# Patient Record
Sex: Male | Born: 1960
Health system: Southern US, Community
[De-identification: ages and names within clinical notes are randomized; demographics above are authoritative.]

## PROBLEM LIST (undated history)

## (undated) DIAGNOSIS — K219 Gastro-esophageal reflux disease without esophagitis: Secondary | ICD-10-CM

## (undated) DIAGNOSIS — F32A Depression, unspecified: Secondary | ICD-10-CM

## (undated) DIAGNOSIS — F329 Major depressive disorder, single episode, unspecified: Secondary | ICD-10-CM

## (undated) DIAGNOSIS — T7840XA Allergy, unspecified, initial encounter: Secondary | ICD-10-CM

## (undated) DIAGNOSIS — F419 Anxiety disorder, unspecified: Secondary | ICD-10-CM

## (undated) DIAGNOSIS — G44209 Tension-type headache, unspecified, not intractable: Secondary | ICD-10-CM

## (undated) DIAGNOSIS — I639 Cerebral infarction, unspecified: Secondary | ICD-10-CM

## (undated) DIAGNOSIS — E785 Hyperlipidemia, unspecified: Secondary | ICD-10-CM

## (undated) HISTORY — DX: Major depressive disorder, single episode, unspecified: F32.9

## (undated) HISTORY — DX: Gastro-esophageal reflux disease without esophagitis: K21.9

## (undated) HISTORY — PX: HERNIA REPAIR: SHX51

## (undated) HISTORY — DX: Depression, unspecified: F32.A

## (undated) HISTORY — DX: Anxiety disorder, unspecified: F41.9

## (undated) HISTORY — DX: Hyperlipidemia, unspecified: E78.5

## (undated) HISTORY — PX: TONSILLECTOMY: SHX5217

## (undated) HISTORY — DX: Cerebral infarction, unspecified: I63.9

## (undated) HISTORY — PX: LIGAMENT REPAIR: SHX5444

## (undated) HISTORY — DX: Tension-type headache, unspecified, not intractable: G44.209

## (undated) HISTORY — DX: Allergy, unspecified, initial encounter: T78.40XA

---

## 1999-06-10 ENCOUNTER — Ambulatory Visit (HOSPITAL_COMMUNITY): Admission: RE | Admit: 1999-06-10 | Discharge: 1999-06-10 | Payer: Self-pay | Admitting: *Deleted

## 1999-06-10 ENCOUNTER — Encounter: Payer: Self-pay | Admitting: *Deleted

## 1999-07-13 ENCOUNTER — Encounter: Admission: RE | Admit: 1999-07-13 | Discharge: 1999-09-14 | Payer: Self-pay | Admitting: *Deleted

## 1999-09-29 ENCOUNTER — Ambulatory Visit (HOSPITAL_COMMUNITY): Admission: RE | Admit: 1999-09-29 | Discharge: 1999-09-29 | Payer: Self-pay | Admitting: *Deleted

## 1999-09-29 ENCOUNTER — Encounter: Payer: Self-pay | Admitting: *Deleted

## 2004-09-21 ENCOUNTER — Ambulatory Visit: Payer: Self-pay | Admitting: Family Medicine

## 2004-12-05 ENCOUNTER — Ambulatory Visit: Payer: Self-pay | Admitting: Family Medicine

## 2005-03-26 ENCOUNTER — Emergency Department (HOSPITAL_COMMUNITY): Admission: EM | Admit: 2005-03-26 | Discharge: 2005-03-26 | Payer: Self-pay | Admitting: Emergency Medicine

## 2005-03-30 ENCOUNTER — Ambulatory Visit: Payer: Self-pay | Admitting: Family Medicine

## 2006-11-07 ENCOUNTER — Ambulatory Visit: Payer: Self-pay | Admitting: Family Medicine

## 2006-11-13 ENCOUNTER — Ambulatory Visit: Payer: Self-pay | Admitting: Family Medicine

## 2006-11-13 LAB — CONVERTED CEMR LAB
ALT: 28 units/L (ref 0–40)
AST: 26 units/L (ref 0–37)
Albumin: 4.1 g/dL (ref 3.5–5.2)
Alkaline Phosphatase: 46 units/L (ref 39–117)
BUN: 11 mg/dL (ref 6–23)
Basophils Absolute: 0.1 10*3/uL (ref 0.0–0.1)
Basophils Relative: 0.9 % (ref 0.0–1.0)
Bilirubin Urine: NEGATIVE
Bilirubin, Direct: 0.2 mg/dL (ref 0.0–0.3)
CO2: 27 meq/L (ref 19–32)
Calcium: 9.4 mg/dL (ref 8.4–10.5)
Chloride: 109 meq/L (ref 96–112)
Cholesterol: 146 mg/dL (ref 0–200)
Creatinine, Ser: 1.4 mg/dL (ref 0.4–1.5)
Creatinine,U: 315.1 mg/dL
Eosinophils Absolute: 0.4 10*3/uL (ref 0.0–0.6)
Eosinophils Relative: 6.2 % — ABNORMAL HIGH (ref 0.0–5.0)
GFR calc Af Amer: 71 mL/min
GFR calc non Af Amer: 59 mL/min
Glucose, Bld: 90 mg/dL (ref 70–99)
HCT: 40.6 % (ref 39.0–52.0)
HDL: 40.2 mg/dL (ref >39.0)
Hemoglobin: 14.6 g/dL (ref 13.0–17.0)
Ketones, ur: NEGATIVE mg/dL
LDL Cholesterol: 89 mg/dL (ref 0–99)
Leukocytes, UA: NEGATIVE
Lymphocytes Relative: 31.5 % (ref 12.0–46.0)
MCHC: 36 g/dL (ref 30.0–36.0)
MCV: 89 fL (ref 78.0–100.0)
Microalb Creat Ratio: 2.2 mg/g (ref 0.0–30.0)
Microalb, Ur: 0.7 mg/dL (ref 0.0–1.9)
Monocytes Absolute: 0.5 10*3/uL (ref 0.2–0.7)
Monocytes Relative: 7.3 % (ref 3.0–11.0)
Neutro Abs: 3.4 10*3/uL (ref 1.4–7.7)
Neutrophils Relative %: 54.1 % (ref 43.0–77.0)
Nitrite: NEGATIVE
Platelets: 228 10*3/uL (ref 150–400)
Potassium: 3.5 meq/L (ref 3.5–5.1)
RBC: 4.56 M/uL (ref 4.22–5.81)
RDW: 12.2 % (ref 11.5–14.6)
Sodium: 144 meq/L (ref 135–145)
Specific Gravity, Urine: 1.025 (ref 1.000–1.03)
TSH: 2.75 microintl units/mL (ref 0.35–5.50)
Total Bilirubin: 0.8 mg/dL (ref 0.3–1.2)
Total CHOL/HDL Ratio: 3.6
Total Protein, Urine: NEGATIVE mg/dL
Total Protein: 6.7 g/dL (ref 6.0–8.3)
Triglycerides: 82 mg/dL (ref 0–149)
Urine Glucose: NEGATIVE mg/dL
Urobilinogen, UA: 0.2 (ref 0.0–1.0)
VLDL: 16 mg/dL (ref 0–40)
WBC: 6.4 10*3/uL (ref 4.5–10.5)
pH: 6 (ref 5.0–8.0)

## 2006-12-05 ENCOUNTER — Ambulatory Visit: Payer: Self-pay | Admitting: Family Medicine

## 2007-07-23 DIAGNOSIS — E785 Hyperlipidemia, unspecified: Secondary | ICD-10-CM | POA: Insufficient documentation

## 2007-07-23 DIAGNOSIS — F329 Major depressive disorder, single episode, unspecified: Secondary | ICD-10-CM

## 2007-07-23 DIAGNOSIS — Z8679 Personal history of other diseases of the circulatory system: Secondary | ICD-10-CM

## 2007-08-20 ENCOUNTER — Encounter: Payer: Self-pay | Admitting: Family Medicine

## 2007-08-27 ENCOUNTER — Telehealth: Payer: Self-pay | Admitting: Family Medicine

## 2007-09-10 ENCOUNTER — Ambulatory Visit: Payer: Self-pay | Admitting: Family Medicine

## 2007-09-10 DIAGNOSIS — F411 Generalized anxiety disorder: Secondary | ICD-10-CM | POA: Insufficient documentation

## 2007-10-24 ENCOUNTER — Encounter: Payer: Self-pay | Admitting: Family Medicine

## 2007-12-05 ENCOUNTER — Telehealth: Payer: Self-pay | Admitting: Family Medicine

## 2007-12-20 ENCOUNTER — Ambulatory Visit: Payer: Self-pay | Admitting: Family Medicine

## 2007-12-20 DIAGNOSIS — K219 Gastro-esophageal reflux disease without esophagitis: Secondary | ICD-10-CM

## 2007-12-25 ENCOUNTER — Encounter: Payer: Self-pay | Admitting: Family Medicine

## 2008-02-11 ENCOUNTER — Encounter: Payer: Self-pay | Admitting: Family Medicine

## 2008-03-04 ENCOUNTER — Ambulatory Visit: Payer: Self-pay | Admitting: Family Medicine

## 2008-03-04 DIAGNOSIS — R42 Dizziness and giddiness: Secondary | ICD-10-CM | POA: Insufficient documentation

## 2008-03-09 LAB — CONVERTED CEMR LAB
ALT: 53 units/L (ref 0–53)
AST: 38 units/L — ABNORMAL HIGH (ref 0–37)
Albumin: 4.5 g/dL (ref 3.5–5.2)
Alkaline Phosphatase: 60 units/L (ref 39–117)
BUN: 10 mg/dL (ref 6–23)
Basophils Absolute: 0 10*3/uL (ref 0.0–0.1)
Basophils Relative: 0.8 % (ref 0.0–1.0)
Bilirubin, Direct: 0.1 mg/dL (ref 0.0–0.3)
CO2: 28 meq/L (ref 19–32)
Calcium: 10 mg/dL (ref 8.4–10.5)
Chloride: 100 meq/L (ref 96–112)
Creatinine, Ser: 1 mg/dL (ref 0.4–1.5)
Eosinophils Absolute: 0.2 10*3/uL (ref 0.0–0.7)
Eosinophils Relative: 2.9 % (ref 0.0–5.0)
GFR calc Af Amer: 104 mL/min
GFR calc non Af Amer: 86 mL/min
Glucose, Bld: 116 mg/dL — ABNORMAL HIGH (ref 70–99)
HCT: 43.7 % (ref 39.0–52.0)
Hemoglobin: 15.2 g/dL (ref 13.0–17.0)
Lymphocytes Relative: 16.4 % (ref 12.0–46.0)
MCHC: 34.9 g/dL (ref 30.0–36.0)
MCV: 89.3 fL (ref 78.0–100.0)
Monocytes Absolute: 0.3 10*3/uL (ref 0.1–1.0)
Monocytes Relative: 4.6 % (ref 3.0–12.0)
Neutro Abs: 4.3 10*3/uL (ref 1.4–7.7)
Neutrophils Relative %: 75.3 % (ref 43.0–77.0)
Platelets: 227 10*3/uL (ref 150–400)
Potassium: 3.8 meq/L (ref 3.5–5.1)
RBC: 4.89 M/uL (ref 4.22–5.81)
RDW: 12.6 % (ref 11.5–14.6)
Sodium: 139 meq/L (ref 135–145)
TSH: 1.54 microintl units/mL (ref 0.35–5.50)
Total Bilirubin: 0.9 mg/dL (ref 0.3–1.2)
Total Protein: 7.2 g/dL (ref 6.0–8.3)
WBC: 5.8 10*3/uL (ref 4.5–10.5)

## 2008-03-25 ENCOUNTER — Ambulatory Visit: Payer: Self-pay | Admitting: Family Medicine

## 2008-04-13 ENCOUNTER — Telehealth: Payer: Self-pay | Admitting: Family Medicine

## 2008-04-15 ENCOUNTER — Encounter (INDEPENDENT_AMBULATORY_CARE_PROVIDER_SITE_OTHER): Payer: Self-pay | Admitting: *Deleted

## 2008-04-15 ENCOUNTER — Telehealth: Payer: Self-pay | Admitting: Family Medicine

## 2008-05-08 ENCOUNTER — Ambulatory Visit: Payer: Self-pay | Admitting: Internal Medicine

## 2008-05-11 ENCOUNTER — Telehealth: Payer: Self-pay | Admitting: Family Medicine

## 2008-06-17 ENCOUNTER — Ambulatory Visit: Payer: Self-pay | Admitting: Family Medicine

## 2008-07-01 ENCOUNTER — Telehealth: Payer: Self-pay | Admitting: Family Medicine

## 2008-07-11 ENCOUNTER — Ambulatory Visit: Payer: Self-pay | Admitting: Family Medicine

## 2008-09-21 ENCOUNTER — Ambulatory Visit: Payer: Self-pay | Admitting: Family Medicine

## 2009-01-12 ENCOUNTER — Telehealth: Payer: Self-pay | Admitting: Family Medicine

## 2009-03-12 ENCOUNTER — Telehealth: Payer: Self-pay | Admitting: Family Medicine

## 2009-08-13 ENCOUNTER — Ambulatory Visit: Payer: Self-pay | Admitting: Family Medicine

## 2009-09-07 ENCOUNTER — Telehealth: Payer: Self-pay | Admitting: Family Medicine

## 2009-09-10 ENCOUNTER — Telehealth: Payer: Self-pay | Admitting: Family Medicine

## 2009-09-20 ENCOUNTER — Ambulatory Visit: Payer: Self-pay | Admitting: Family Medicine

## 2009-09-20 LAB — CONVERTED CEMR LAB
Bilirubin Urine: NEGATIVE
Glucose, Urine, Semiquant: NEGATIVE
Nitrite: NEGATIVE
Protein, U semiquant: NEGATIVE
Specific Gravity, Urine: 1.02
Urobilinogen, UA: 0.2
WBC Urine, dipstick: NEGATIVE
pH: 5.5

## 2009-09-21 LAB — CONVERTED CEMR LAB
ALT: 33 units/L (ref 0–53)
AST: 25 units/L (ref 0–37)
Albumin: 4.2 g/dL (ref 3.5–5.2)
Alkaline Phosphatase: 60 units/L (ref 39–117)
BUN: 10 mg/dL (ref 6–23)
Basophils Absolute: 0.1 10*3/uL (ref 0.0–0.1)
Basophils Relative: 0.9 % (ref 0.0–3.0)
Bilirubin, Direct: 0 mg/dL (ref 0.0–0.3)
CO2: 29 meq/L (ref 19–32)
Calcium: 9.4 mg/dL (ref 8.4–10.5)
Chloride: 108 meq/L (ref 96–112)
Cholesterol: 165 mg/dL (ref 0–200)
Creatinine, Ser: 1.1 mg/dL (ref 0.4–1.5)
Eosinophils Absolute: 0.4 10*3/uL (ref 0.0–0.7)
Eosinophils Relative: 6.7 % — ABNORMAL HIGH (ref 0.0–5.0)
GFR calc non Af Amer: 76.2 mL/min (ref >60)
Glucose, Bld: 105 mg/dL — ABNORMAL HIGH (ref 70–99)
HCT: 42.5 % (ref 39.0–52.0)
HDL: 37 mg/dL — ABNORMAL LOW (ref >39.00)
Hemoglobin: 15 g/dL (ref 13.0–17.0)
LDL Cholesterol: 98 mg/dL (ref 0–99)
Lymphocytes Relative: 29.3 % (ref 12.0–46.0)
Lymphs Abs: 1.8 10*3/uL (ref 0.7–4.0)
MCHC: 35.3 g/dL (ref 30.0–36.0)
MCV: 90.1 fL (ref 78.0–100.0)
Monocytes Absolute: 0.4 10*3/uL (ref 0.1–1.0)
Monocytes Relative: 6.3 % (ref 3.0–12.0)
Neutro Abs: 3.3 10*3/uL (ref 1.4–7.7)
Neutrophils Relative %: 56.8 % (ref 43.0–77.0)
Platelets: 193 10*3/uL (ref 150.0–400.0)
Potassium: 4.3 meq/L (ref 3.5–5.1)
RBC: 4.72 M/uL (ref 4.22–5.81)
RDW: 12 % (ref 11.5–14.6)
Sodium: 143 meq/L (ref 135–145)
TSH: 2.93 microintl units/mL (ref 0.35–5.50)
Total Bilirubin: 0.9 mg/dL (ref 0.3–1.2)
Total CHOL/HDL Ratio: 4
Total Protein: 7 g/dL (ref 6.0–8.3)
Triglycerides: 148 mg/dL (ref 0.0–149.0)
VLDL: 29.6 mg/dL (ref 0.0–40.0)
WBC: 6 10*3/uL (ref 4.5–10.5)

## 2009-09-24 ENCOUNTER — Telehealth: Payer: Self-pay | Admitting: Family Medicine

## 2009-09-27 ENCOUNTER — Ambulatory Visit: Payer: Self-pay | Admitting: Family Medicine

## 2010-01-07 ENCOUNTER — Ambulatory Visit: Payer: Self-pay | Admitting: Family Medicine

## 2010-04-08 ENCOUNTER — Ambulatory Visit: Payer: Self-pay | Admitting: Family Medicine

## 2010-06-27 ENCOUNTER — Telehealth: Payer: Self-pay | Admitting: Family Medicine

## 2010-07-08 ENCOUNTER — Ambulatory Visit: Payer: Self-pay | Admitting: Family Medicine

## 2010-07-11 LAB — CONVERTED CEMR LAB
Albumin: 4.6 g/dL (ref 3.5–5.2)
BUN: 16 mg/dL (ref 6–23)
Basophils Absolute: 0.1 10*3/uL (ref 0.0–0.1)
CO2: 29 meq/L (ref 19–32)
Calcium: 10 mg/dL (ref 8.4–10.5)
Eosinophils Absolute: 0.3 10*3/uL (ref 0.0–0.7)
Glucose, Bld: 105 mg/dL — ABNORMAL HIGH (ref 70–99)
HCT: 43.8 % (ref 39.0–52.0)
Hemoglobin: 15.3 g/dL (ref 13.0–17.0)
Lymphocytes Relative: 20.7 % (ref 12.0–46.0)
Lymphs Abs: 1.1 10*3/uL (ref 0.7–4.0)
MCHC: 34.8 g/dL (ref 30.0–36.0)
Neutro Abs: 3.5 10*3/uL (ref 1.4–7.7)
Platelets: 219 10*3/uL (ref 150.0–400.0)
RDW: 13.1 % (ref 11.5–14.6)
Sodium: 142 meq/L (ref 135–145)
TSH: 1.54 microintl units/mL (ref 0.35–5.50)
Total Bilirubin: 0.8 mg/dL (ref 0.3–1.2)
Total CK: 78 units/L (ref 7–232)
Vit D, 25-Hydroxy: 38 ng/mL (ref 30–89)

## 2010-08-12 ENCOUNTER — Telehealth: Payer: Self-pay | Admitting: Family Medicine

## 2010-09-09 ENCOUNTER — Encounter: Payer: Self-pay | Admitting: Family Medicine

## 2010-11-08 NOTE — Assessment & Plan Note (Signed)
Summary: ? allergies//ccm   Vital Signs:  Patient profile:   50 year old male Weight:      231 pounds Temp:     98.6 degrees F oral Resp:     12 per minute BP sitting:   120 / 82  Vitals Entered By: Lynann Beaver CMA (January 07, 2010 3:25 PM) CC: seasnal allergy symptoms Is Patient Diabetic? No Pain Assessment Patient in pain? no        History of Present Illness: Here for a flare of his typical spring time allergies with stuffy head, sinus congesiton, PND, and HA. No fever or ST or cough. On Allegra and Flonase.  Current Medications (verified): 1)  Lovastatin 40 Mg Tabs (Lovastatin) .Marland Kitchen.. 1 By Mouth Once Daily 2)  Flonase 50 Mcg/act  Susp (Fluticasone Propionate) .... 2 Sprays Daily As Needed 3)  Aspirin 81 Mg  Tbec (Aspirin) .... Once Daily 4)  Butalbital-Apap-Caffeine 50-325-40 Mg  Tabs (Butalbital-Apap-Caffeine) .Marland Kitchen.. 1 Tab Every 6 Hours As Needed For Headache 5)  Proctofoam Hc 1-1 %  Foam (Hydrocortisone Ace-Pramoxine) .... Three Times A Day As Needed 6)  Meclizine Hcl 25 Mg  Tabs (Meclizine Hcl) .Marland Kitchen.. 1 Every 4 Hours As Needed Dizziness 7)  Alprazolam 0.25 Mg Tabs (Alprazolam) .Marland Kitchen.. 1 By Mouth Three Times A Day 8)  Allegra 180 Mg Tabs (Fexofenadine Hcl) .... Once Daily  Allergies (verified): 1)  ! Doxycycline Hyclate (Doxycycline Hyclate) 2)  ! * Latex ??  Past History:  Past Medical History: Reviewed history from 09/27/2009 and no changes required. Allergic rhinitis Hyperlipidemia Cerebrovascular accident, hx of, in cerebellum Depression Anxiety GERD  tension headaches  Review of Systems  The patient denies anorexia, fever, weight loss, weight gain, vision loss, decreased hearing, hoarseness, chest pain, syncope, dyspnea on exertion, peripheral edema, prolonged cough, hemoptysis, abdominal pain, melena, hematochezia, severe indigestion/heartburn, hematuria, incontinence, genital sores, muscle weakness, suspicious skin lesions, transient blindness, difficulty  walking, depression, unusual weight change, abnormal bleeding, enlarged lymph nodes, angioedema, breast masses, and testicular masses.    Physical Exam  General:  Well-developed,well-nourished,in no acute distress; alert,appropriate and cooperative throughout examination Head:  Normocephalic and atraumatic without obvious abnormalities. No apparent alopecia or balding. Eyes:  No corneal or conjunctival inflammation noted. EOMI. Perrla. Funduscopic exam benign, without hemorrhages, exudates or papilledema. Vision grossly normal. Ears:  External ear exam shows no significant lesions or deformities.  Otoscopic examination reveals clear canals, tympanic membranes are intact bilaterally without bulging, retraction, inflammation or discharge. Hearing is grossly normal bilaterally. Nose:  External nasal examination shows no deformity or inflammation. Nasal mucosa are pink and moist without lesions or exudates. Mouth:  Oral mucosa and oropharynx without lesions or exudates.  Teeth in good repair. Neck:  No deformities, masses, or tenderness noted. Lungs:  Normal respiratory effort, chest expands symmetrically. Lungs are clear to auscultation, no crackles or wheezes.   Impression & Recommendations:  Problem # 1:  ALLERGIC RHINITIS (ICD-477.9)  The following medications were removed from the medication list:    Promethazine Hcl 25 Mg Tabs (Promethazine hcl) .Marland Kitchen... 1 every 4 hours as needed nausea    Allegra Odt 30 Mg Tbdp (Fexofenadine hcl) ..... Once daily His updated medication list for this problem includes:    Flonase 50 Mcg/act Susp (Fluticasone propionate) .Marland Kitchen... 2 sprays daily as needed    Allegra 180 Mg Tabs (Fexofenadine hcl) ..... Once daily  Orders: Depo- Medrol 80mg  (J1040) Admin of Therapeutic Inj  intramuscular or subcutaneous (16109)  Complete Medication List: 1)  Lovastatin 40 Mg Tabs (Lovastatin) .Marland Kitchen.. 1 by mouth once daily 2)  Flonase 50 Mcg/act Susp (Fluticasone propionate) ....  2 sprays daily as needed 3)  Aspirin 81 Mg Tbec (Aspirin) .... Once daily 4)  Butalbital-apap-caffeine 50-325-40 Mg Tabs (Butalbital-apap-caffeine) .Marland Kitchen.. 1 tab every 6 hours as needed for headache 5)  Proctofoam Hc 1-1 % Foam (Hydrocortisone ace-pramoxine) .... Three times a day as needed 6)  Meclizine Hcl 25 Mg Tabs (Meclizine hcl) .Marland Kitchen.. 1 every 4 hours as needed dizziness 7)  Alprazolam 0.25 Mg Tabs (Alprazolam) .Marland Kitchen.. 1 by mouth three times a day 8)  Allegra 180 Mg Tabs (Fexofenadine hcl) .... Once daily  Patient Instructions: 1)  Please schedule a follow-up appointment as needed .  Prescriptions: MECLIZINE HCL 25 MG  TABS (MECLIZINE HCL) 1 every 4 hours as needed dizziness  #60 x 5   Entered and Authorized by:   Nelwyn Salisbury MD   Signed by:   Nelwyn Salisbury MD on 01/07/2010   Method used:   Electronically to        [facility]. [address] [phone]* (retail)       [address] [place], [place]  [id number]       Ph: [phone]       Fax: [phone]   RxID:   [id number]    Medication Administration  Injection # 1:    Medication: Depo- Medrol 80mg     Diagnosis: ALLERGIC RHINITIS (ICD-477.9)    Route: IM    Site: RUOQ gluteus    Exp Date: 08/09/2012    Lot #: [id number]    Mfr: Pharmacia    Comments: 160 mg given    Patient tolerated injection without complications    Given by: Lynann Beaver CMA (January 07, 2010 3:57 PM)  Orders Added: 1)  Est. Patient Level IV [38756] 2)  Depo- Medrol 80mg  [J1040] 3)  Admin of Therapeutic Inj  intramuscular or subcutaneous [43329]

## 2010-11-08 NOTE — Progress Notes (Signed)
Summary: lovastatin  Phone Note Call from Patient Call back at Home Phone [phone] Call back at [phone]   Summary of Call: Zantac for stomach.  Whenever take it & then Lovastatin, getting a lot of muscle cramps.  Yesterday did not take Lovastatin, haven't had that problem today.   think I'll leave it off for awhile.  Want Dr. Claris Che advice.   Initial call taken by: Rudy Jew, RN,  June 27, 2010 3:54 PM  Follow-up for Phone Call        okay. stop Lovastatin for 3 months and watch a strict diet. recheck a lipid panel after that Follow-up by: Nelwyn Salisbury MD,  June 28, 2010 8:52 AM  Additional Follow-up for Phone Call Additional follow up Details #1::        pt aware. verbalized understanding pt stated will call back sch labs  Additional Follow-up by: Pura Spice, RN,  June 28, 2010 2:55 PM

## 2010-11-08 NOTE — Assessment & Plan Note (Signed)
Summary: GERD/dm   Vital Signs:  Patient profile:   50 year old male Weight:      225 pounds BMI:     27.49 BP sitting:   130 / 98  (left arm) Cuff size:   regular  Vitals Entered By: Raechel Ache, RN (April 08, 2010 1:08 PM) CC: C/o reflux x 6 weeks. Felt faint after taking Prilosec.   History of Present Illness: Here to discuss GERD. Over the past few months he has had a lot of heartburn and belching. BMs are regular. Mylanta helps but it doesn't last long. He tried some Prilosec OTC but did not tolerate it well. It cuased lightheadedness and HAs.   Allergies: 1)  ! Doxycycline Hyclate (Doxycycline Hyclate) 2)  ! * Latex ??  Past History:  Past Medical History: Reviewed history from 09/27/2009 and no changes required. Allergic rhinitis Hyperlipidemia Cerebrovascular accident, hx of, in cerebellum Depression Anxiety GERD  tension headaches  Review of Systems  The patient denies anorexia, fever, weight loss, weight gain, vision loss, decreased hearing, hoarseness, chest pain, syncope, dyspnea on exertion, peripheral edema, prolonged cough, headaches, hemoptysis, abdominal pain, melena, hematochezia, hematuria, incontinence, genital sores, muscle weakness, suspicious skin lesions, transient blindness, difficulty walking, depression, unusual weight change, abnormal bleeding, enlarged lymph nodes, angioedema, breast masses, and testicular masses.    Physical Exam  General:  Well-developed,well-nourished,in no acute distress; alert,appropriate and cooperative throughout examination Lungs:  Normal respiratory effort, chest expands symmetrically. Lungs are clear to auscultation, no crackles or wheezes. Heart:  Normal rate and regular rhythm. S1 and S2 normal without gallop, murmur, click, rub or other extra sounds. Abdomen:  Bowel sounds positive,abdomen soft and non-tender without masses, organomegaly or hernias noted.   Impression & Recommendations:  Problem # 1:  GERD  (ICD-530.81)  His updated medication list for this problem includes:    Zantac 75 75 Mg Tabs (Ranitidine hcl) .Marland Kitchen..Marland Kitchen Two times a day  Complete Medication List: 1)  Lovastatin 40 Mg Tabs (Lovastatin) .Marland Kitchen.. 1 by mouth once daily 2)  Flonase 50 Mcg/act Susp (Fluticasone propionate) .... 2 sprays daily as needed 3)  Aspirin 81 Mg Tbec (Aspirin) .... Once daily 4)  Butalbital-apap-caffeine 50-325-40 Mg Tabs (Butalbital-apap-caffeine) .Marland Kitchen.. 1 tab every 6 hours as needed for headache 5)  Proctofoam Hc 1-1 % Foam (Hydrocortisone ace-pramoxine) .... Three times a day as needed 6)  Meclizine Hcl 25 Mg Tabs (Meclizine hcl) .Marland Kitchen.. 1 every 4 hours as needed dizziness 7)  Alprazolam 0.25 Mg Tabs (Alprazolam) .Marland Kitchen.. 1 by mouth three times a day 8)  Allegra 180 Mg Tabs (Fexofenadine hcl) .... Once daily 9)  Zantac 75 75 Mg Tabs (Ranitidine hcl) .... Two times a day  Patient Instructions: 1)  we discussed this for 30 minutes. We talked about dietary changes he could make, and he needs to reduce his caffeine intake. Try Zantac OTC. 2)  Please schedule a follow-up appointment as needed .

## 2010-11-08 NOTE — Assessment & Plan Note (Signed)
Summary: weakness/no energy/cjr   Vital Signs:  Patient profile:   50 year old male Weight:      222 pounds O2 Sat:      97 % Temp:     98.4 degrees F BP sitting:   130 / 80  Vitals Entered By: Pura Spice, RN (July 08, 2010 1:48 PM) CC: no energy    History of Present Illness: Here for generalized weakness which has bothered him for about a month. No other symptoms, no neurologic deficits. He stopped taking Lovastatin about 2 weeks ago.   Allergies: 1)  ! Doxycycline Hyclate (Doxycycline Hyclate) 2)  ! * Latex ??  Past History:  Past Medical History: Reviewed history from 09/27/2009 and no changes required. Allergic rhinitis Hyperlipidemia Cerebrovascular accident, hx of, in cerebellum Depression Anxiety GERD  tension headaches  Review of Systems  The patient denies anorexia, fever, weight loss, weight gain, vision loss, decreased hearing, hoarseness, chest pain, syncope, dyspnea on exertion, peripheral edema, prolonged cough, headaches, hemoptysis, abdominal pain, melena, hematochezia, severe indigestion/heartburn, hematuria, incontinence, genital sores, muscle weakness, suspicious skin lesions, transient blindness, difficulty walking, depression, unusual weight change, abnormal bleeding, enlarged lymph nodes, angioedema, breast masses, and testicular masses.         Flu Vaccine Consent Questions     Do you have a history of severe allergic reactions to this vaccine? no    Any prior history of allergic reactions to egg and/or gelatin? no    Do you have a sensitivity to the preservative Thimersol? no    Do you have a past history of Guillan-Barre Syndrome? no    Do you currently have an acute febrile illness? no    Have you ever had a severe reaction to latex? no    Vaccine information given and explained to patient? yes    Are you currently pregnant? no    Lot Number:[id number]   Exp Date:04/08/2011   Site Given  Left Deltoid IM Pura Spice, RN  July 08, 2010 2:10 PM    Physical Exam  General:  Well-developed,well-nourished,in no acute distress; alert,appropriate and cooperative throughout examination Neck:  No deformities, masses, or tenderness noted. Lungs:  Normal respiratory effort, chest expands symmetrically. Lungs are clear to auscultation, no crackles or wheezes. Heart:  Normal rate and regular rhythm. S1 and S2 normal without gallop, murmur, click, rub or other extra sounds. Neurologic:  No cranial nerve deficits noted. Station and gait are normal. Plantar reflexes are down-going bilaterally. DTRs are symmetrical throughout. Sensory, motor and coordinative functions appear intact.   Impression & Recommendations:  Problem # 1:  WEAKNESS (ICD-780.79)  Orders: Venipuncture (16109) TLB-BMP (Basic Metabolic Panel-BMET) (80048-METABOL) TLB-CBC Platelet - w/Differential (85025-CBCD) TLB-Hepatic/Liver Function Pnl (80076-HEPATIC) TLB-TSH (Thyroid Stimulating Hormone) (84443-TSH) TLB-B12, Serum-Total ONLY (60454-U98) T-Vitamin D (25-Hydroxy) (11914-78295) TLB-CK Total Only(Creatine Kinase/CPK) (82550-CK) Specimen Handling (62130)  Problem # 2:  CEREBROVASCULAR ACCIDENT, HX OF (ICD-V12.50)  Complete Medication List: 1)  Flonase 50 Mcg/act Susp (Fluticasone propionate) .... 2 sprays daily as needed 2)  Aspirin 81 Mg Tbec (Aspirin) .... Once daily 3)  Butalbital-apap-caffeine 50-325-40 Mg Tabs (Butalbital-apap-caffeine) .Marland Kitchen.. 1 tab every 6 hours as needed for headache 4)  Proctofoam Hc 1-1 % Foam (Hydrocortisone ace-pramoxine) .... Three times a day as needed 5)  Meclizine Hcl 25 Mg Tabs (Meclizine hcl) .Marland Kitchen.. 1 every 4 hours as needed dizziness 6)  Alprazolam 0.25 Mg Tabs (Alprazolam) .Marland Kitchen.. 1 by mouth three times a day 7)  Zantac 75 75 Mg Tabs (  Ranitidine hcl) .... Two times a day 8)  Allegra Allergy Childrens 30 Mg Tabs (Fexofenadine hcl) .... Once daily  Other Orders: Admin 1st Vaccine (04540) Flu Vaccine 43yrs +  (98119)  Patient Instructions: 1)  get labs. Stay off statins for now.

## 2010-11-08 NOTE — Progress Notes (Signed)
Summary: Flonase refill  Phone Note Refill Request Message from:  Fax from Pharmacy on August 12, 2010 11:53 AM  Refills Requested: Medication #1:  FLONASE 50 MCG/ACT  SUSP 2 sprays daily as needed   Dosage confirmed as above?Dosage Confirmed Initial call taken by: Josph Macho RMA,  August 12, 2010 11:53 AM    Prescriptions: Aleda Grana 50 MCG/ACT  SUSP (FLUTICASONE PROPIONATE) 2 sprays daily as needed  #30 days x 2   Entered by:   Josph Macho RMA   Authorized by:   Nelwyn Salisbury MD   Signed by:   Josph Macho RMA on 08/12/2010   Method used:   Electronically to        [facility] (retail)       [address]       [place], [place]  [address]       Ph: [phone]       Fax: [phone]   RxID:   [id number]

## 2010-11-10 NOTE — Letter (Signed)
Summary: [facility]  [facility]   Imported By: Maryln Gottron 09/22/2010 14:08:32  _____________________________________________________________________  External Attachment:    Type:   Image     Comment:   External Document

## 2010-11-25 ENCOUNTER — Other Ambulatory Visit: Payer: Self-pay | Admitting: Family Medicine

## 2010-12-29 ENCOUNTER — Telehealth: Payer: Self-pay | Admitting: *Deleted

## 2010-12-29 NOTE — Telephone Encounter (Signed)
Spoke with pt wanted dosage of depo medrol given to him on 01-2010  Dosage was depo medrol 160mg 

## 2010-12-29 NOTE — Telephone Encounter (Signed)
Please call re: his Prednisone injection for his allergist and needs to speak to nurse ASAP.

## 2010-12-30 NOTE — Telephone Encounter (Signed)
Agreed -

## 2011-02-22 ENCOUNTER — Other Ambulatory Visit: Payer: Self-pay | Admitting: Family Medicine

## 2011-02-23 NOTE — Telephone Encounter (Signed)
Call in #90 with 5 rf 

## 2011-02-24 ENCOUNTER — Other Ambulatory Visit: Payer: Self-pay | Admitting: Family Medicine

## 2011-02-24 MED ORDER — ALPRAZOLAM 0.25 MG PO TABS: 0.2500 mg | ORAL_TABLET | Freq: Three times a day (TID) | ORAL | Status: AC | PRN

## 2011-02-24 NOTE — Telephone Encounter (Signed)
Pt called and said that [facility] in [place], sent over a refill req for Alprazolam 0.25 mg, with no response. Pt is req this be called in today asap. [facility] in [place] [phone]

## 2011-02-24 NOTE — Telephone Encounter (Signed)
rx called in to [facility] #[phone]; pt is aware

## 2011-02-24 NOTE — Telephone Encounter (Signed)
Call this in to take TID, #90 with 5 rf

## 2011-02-24 NOTE — Assessment & Plan Note (Signed)
[facility]                            [facility] OFFICE NOTE   NAME:[facility], [facility]                     MRN:          [id number]  DATE:11/07/2006                            DOB:          14-May-1961    This is a 50 year old gentleman here for a complete physical  examination.  He has a couple of minor things to discuss.  The main  thing that has been bothering him lately is hemorrhoids.  He has had  them for years.  They often swell, itch, and burn.  He has used over-the-  counter products like Preparation H, but it does not seem to help.  Otherwise, he is maintaining his status quo.  As noted in the chart, he  is status post a stroke due to a vertebral artery dissection, which  occurred in August of 2000.  He had been seeing a neurologist by the  name of Dr. Gershon Crane at [facility] for about 5 years after  this.  He remained quite stable, although he does have some long-lasting  problems, primarily with some short term memory issues.  He has not seen  the neurologist since 2005 and was told at that time to follow up only  as needed.  Again, Rhyse is doing reasonably well.  He is disabled ever  since then.  He has been seeing Dr. Arbutus Ped for podiatric care due to  pain in his feet after a series of cortisone shots and, having had some  orthotics made, his feet are doing much better.  About 1 month ago he  began receiving allergy shots per Dr. Linus Salmons, who is an  allergist in [place].  He thinks that this may be helpful.  He  continues to see Dr. Jennelle Human for psychiatric care, primarily for  depression and anxiety.  This has been doing reasonably well lately  also.  He still has some problems sleeping at times.  We have also been  following him for hyperlipidemia.  For other details of his past medical  history, family history, social history, habits, Melvern Banker, I refer you  to our introductory note to [place] dated June 02, 1999.   ALLERGIES:  LATEX, DOXYCYCLINE.   CURRENT MEDICATIONS:  1. Ambien 10 mg 1/4 to 1/2 of a tablet nightly as needed.  2. Flonase nasal sprays daily.  3. Aspirin 81 mg daily.  4. Lovastatin 40 mg daily.  5. Allergy shots.  6. Xanax 0.25 mg 1/2 to 1 tablet as needed.   OBJECTIVE:  Height 76 inches, weight 225 pounds.  BP 134/80, pulse 76  and regular.  GENERAL:  He appears to be doing well.  His affect is bright.  SKIN:  Clear.  EYES:  Clear.  Wears glasses.  EARS:  Clear.  OROPHARYNX:  Clear.  NECK:  Supple without lymphadenopathy or masses.  LUNGS:  Clear.  CARDIAC:  Rate and rhythm are regular without gallops, murmurs, or rubs.  Distal pulses full.  ABDOMEN:  Soft.  Normal bowel sounds.  Nontender.  No masses.  GENITALIA:  Normal male.  He is circumcised.  EXTREMITIES:  No cyanosis, clubbing, or edema.  NEUROLOGIC:  Grossly intact.   ASSESSMENT AND PLAN:  Problem #1:  Complete physical.  We talked about  increasing exercise and dropping a few pounds of weight.  We will also  set him up for routine laboratories.  Problem #2:  Hyperlipidemia.  We will have him return soon in a fasting  state to check a lipid panel.  Problem #3:  Status post stroke.  Stable.  Problem #4:  Allergies.  He will follow up with his allergist.  Problem #5:  Depression.  He will follow up with his psychiatrist.  Problem #6:  Hemorrhoids.  We tried Proctofoam HC 3 times daily as  needed.  I also talked to him about getting more fiber in his daily  diet.     Tera Mater. Clent Ridges, MD  Electronically Signed    SAF/MedQ  DD: 11/07/2006  DT: 11/07/2006  Job #: [id number]

## 2011-02-24 NOTE — Telephone Encounter (Signed)
Please call this in  

## 2011-03-02 NOTE — Telephone Encounter (Signed)
Error/Phoned in on 02/24/11

## 2011-04-05 ENCOUNTER — Telehealth: Payer: Self-pay | Admitting: Family Medicine

## 2011-04-05 ENCOUNTER — Encounter: Payer: Self-pay | Admitting: Family Medicine

## 2011-04-05 ENCOUNTER — Ambulatory Visit (INDEPENDENT_AMBULATORY_CARE_PROVIDER_SITE_OTHER): Payer: Medicare Other | Admitting: Family Medicine

## 2011-04-05 VITALS — BP 124/80 | Temp 98.4°F | Wt 225.0 lb

## 2011-04-05 DIAGNOSIS — K589 Irritable bowel syndrome without diarrhea: Secondary | ICD-10-CM

## 2011-04-05 DIAGNOSIS — K219 Gastro-esophageal reflux disease without esophagitis: Secondary | ICD-10-CM

## 2011-04-05 NOTE — Progress Notes (Signed)
  Subjective:    Patient ID: [facility], male    DOB: Jun 27, 1961, 50 y.o.   MRN: [id number]  HPI Here to discuss chronic GI problems like GERD, bloating, and constipation. He has used Benefiber and Zantac in the past with good results, but he never used them consistently. He has also tried an OTC probiotic agent sporadically. He does drink plenty of water.   Review of Systems  Constitutional: Negative.   Eyes: Negative.   Respiratory: Negative.   Gastrointestinal: Positive for constipation and abdominal distention. Negative for nausea, vomiting, abdominal pain, diarrhea and blood in stool.       Objective:   Physical Exam  Constitutional: He appears well-developed and well-nourished.  Abdominal: Soft. Bowel sounds are normal. He exhibits no distension and no mass. There is no tenderness. There is no rebound and no guarding.          Assessment & Plan:  He has a combination of GERD and IBS. I suggested he use Benefiber, probiotic, and Zantac together on a daily basis.

## 2011-04-05 NOTE — Telephone Encounter (Signed)
Pt called req script for T-Retinoin and Clindamycin. Pt says that he forgot to ask Dr Clent Ridges for these scripts while here for ov today. Pls call in to [facility] in [place] [phone]

## 2011-04-06 NOTE — Telephone Encounter (Signed)
I have no record of ever prescribing these for him. What are they for? What are the exact dosages and directions?

## 2011-04-06 NOTE — Telephone Encounter (Signed)
Clindamycin Phosphate Topical Gel 1%   T-Retinion Cream 0.1%   Per pt, both meds directions are to apply every night for acne. States he thought that Dr. Clent Ridges may have rx'ed them before but knows that he did get them from a dermatologist.

## 2011-04-07 MED ORDER — CLINDAMYCIN PHOSPHATE 1 % EX GEL: Freq: Two times a day (BID) | CUTANEOUS | Status: DC

## 2011-04-07 MED ORDER — TRETINOIN 0.1 % EX CREA: TOPICAL_CREAM | Freq: Every day | CUTANEOUS | Status: DC

## 2011-04-07 NOTE — Telephone Encounter (Signed)
Call in both of these for a 30 day supply plus 11 rf

## 2011-08-23 ENCOUNTER — Telehealth: Payer: Self-pay | Admitting: Family Medicine

## 2011-08-23 NOTE — Telephone Encounter (Signed)
Pt called and is sch to get a flu vax at 1:30pm on fri 08/25/11. Pt is req to also come in to see Dr Clent Ridges for ov that day, re: cough? Pls advise.

## 2011-08-23 NOTE — Telephone Encounter (Signed)
Refill request for Alprazolam 0.25 mg take 1 po tid prn and pt last here on 04/05/11.

## 2011-08-24 NOTE — Telephone Encounter (Signed)
Make him an OV for Friday afternoon. We will take care of the Xanax at that time

## 2011-08-24 NOTE — Telephone Encounter (Signed)
Called and lft vm for pt to call back and sch ov with Dr Clent Ridges tomorrow afternoon, as noted. Waiting on call back.

## 2011-08-24 NOTE — Telephone Encounter (Signed)
Pt returned call. Pt has been sch for 2:45 on Friday 08/25/11 as noted, per Dr Clent Ridges.

## 2011-08-25 ENCOUNTER — Ambulatory Visit (INDEPENDENT_AMBULATORY_CARE_PROVIDER_SITE_OTHER): Payer: Medicare Other | Admitting: Family Medicine

## 2011-08-25 ENCOUNTER — Ambulatory Visit: Payer: Medicare Other | Admitting: Family Medicine

## 2011-08-25 ENCOUNTER — Encounter: Payer: Self-pay | Admitting: Family Medicine

## 2011-08-25 VITALS — BP 124/80 | HR 90 | Temp 98.7°F | Wt 227.0 lb

## 2011-08-25 DIAGNOSIS — F411 Generalized anxiety disorder: Secondary | ICD-10-CM

## 2011-08-25 DIAGNOSIS — Z9109 Other allergy status, other than to drugs and biological substances: Secondary | ICD-10-CM

## 2011-08-25 DIAGNOSIS — J309 Allergic rhinitis, unspecified: Secondary | ICD-10-CM

## 2011-08-25 DIAGNOSIS — F419 Anxiety disorder, unspecified: Secondary | ICD-10-CM

## 2011-08-25 MED ORDER — ALPRAZOLAM 0.25 MG PO TABS: 0.2500 mg | ORAL_TABLET | Freq: Three times a day (TID) | ORAL | Status: DC | PRN

## 2011-08-25 NOTE — Progress Notes (Signed)
  Subjective:    Patient ID: Bernard James, male    DOB: [birth year], 50 y.o.   MRN: [id number]  HPI Here for a flu shot and for refills on Xanax. His anxiety has been well controlled. His allergies have been a challenge this fall but they are getting better.    Review of Systems  Constitutional: Negative.   HENT: Positive for congestion, rhinorrhea, sneezing and postnasal drip.   Respiratory: Negative.   Psychiatric/Behavioral: Negative.        Objective:   Physical Exam  Constitutional: He appears well-developed and well-nourished.  HENT:  Right Ear: External ear normal.  Left Ear: External ear normal.  Nose: Nose normal.  Mouth/Throat: Oropharynx is clear and moist.  Eyes: Conjunctivae are normal. Pupils are equal, round, and reactive to light.  Neck: No thyromegaly present.  Pulmonary/Chest: Effort normal and breath sounds normal.  Lymphadenopathy:    He has no cervical adenopathy.          Assessment & Plan:  Refilled Xanax. Given flu shot

## 2011-10-16 ENCOUNTER — Encounter: Payer: Self-pay | Admitting: Family Medicine

## 2011-10-16 ENCOUNTER — Ambulatory Visit (INDEPENDENT_AMBULATORY_CARE_PROVIDER_SITE_OTHER): Payer: Medicare Other | Admitting: Family Medicine

## 2011-10-16 VITALS — BP 130/90 | HR 95 | Temp 98.7°F | Wt 221.0 lb

## 2011-10-16 DIAGNOSIS — K529 Noninfective gastroenteritis and colitis, unspecified: Secondary | ICD-10-CM

## 2011-10-16 DIAGNOSIS — K5289 Other specified noninfective gastroenteritis and colitis: Secondary | ICD-10-CM

## 2011-10-16 MED ORDER — METRONIDAZOLE 500 MG PO TABS: 500.0000 mg | ORAL_TABLET | Freq: Two times a day (BID) | ORAL | Status: AC

## 2011-10-16 NOTE — Progress Notes (Signed)
  Subjective:    Patient ID: Bernard James, male    DOB: 1961-10-09, 51 y.o.   MRN: [id number]  HPI Here for 5 days of mild stomach cramps and diarrhea. No fever or vomiting. Drinking fluids.    Review of Systems  Constitutional: Negative.   Respiratory: Negative.   Cardiovascular: Negative.   Gastrointestinal: Positive for diarrhea. Negative for nausea, vomiting, abdominal pain, constipation, blood in stool and abdominal distention.       Objective:   Physical Exam  Constitutional: He appears well-developed and well-nourished.  Abdominal: Soft. Bowel sounds are normal. He exhibits no distension and no mass. There is no tenderness. There is no rebound and no guarding.          Assessment & Plan:  Add Imodium AD prn

## 2012-01-10 ENCOUNTER — Other Ambulatory Visit: Payer: Self-pay | Admitting: Family Medicine

## 2012-02-21 ENCOUNTER — Other Ambulatory Visit: Payer: Self-pay | Admitting: Family Medicine

## 2012-02-22 ENCOUNTER — Telehealth: Payer: Self-pay | Admitting: Family Medicine

## 2012-02-22 NOTE — Telephone Encounter (Signed)
Call in #90 with 5 rf 

## 2012-02-22 NOTE — Telephone Encounter (Signed)
Patient called stating that he called his pharmacy for a refill of his aprazolam and they stated they have not heard back from the MD. Please assist.

## 2012-02-23 NOTE — Telephone Encounter (Signed)
I left a voice message at 10:32 this morning at his pharmacy.

## 2012-02-23 NOTE — Telephone Encounter (Signed)
I spoke with pt  

## 2012-02-23 NOTE — Telephone Encounter (Signed)
Voice message left at pharmacy

## 2012-02-26 DIAGNOSIS — J343 Hypertrophy of nasal turbinates: Secondary | ICD-10-CM | POA: Diagnosis not present

## 2012-03-08 ENCOUNTER — Other Ambulatory Visit: Payer: Self-pay | Admitting: Family Medicine

## 2012-03-27 DIAGNOSIS — H43819 Vitreous degeneration, unspecified eye: Secondary | ICD-10-CM | POA: Diagnosis not present

## 2012-03-27 DIAGNOSIS — H5210 Myopia, unspecified eye: Secondary | ICD-10-CM | POA: Diagnosis not present

## 2012-06-05 ENCOUNTER — Telehealth: Payer: Self-pay | Admitting: Family Medicine

## 2012-06-05 MED ORDER — FLUTICASONE PROPIONATE 50 MCG/ACT NA SUSP: 2.0000 | Freq: Every day | NASAL | Status: DC | PRN

## 2012-06-05 NOTE — Telephone Encounter (Signed)
I sent script e-scribe for Fluticasone.

## 2012-07-31 ENCOUNTER — Encounter: Payer: Self-pay | Admitting: Family Medicine

## 2012-07-31 ENCOUNTER — Ambulatory Visit (INDEPENDENT_AMBULATORY_CARE_PROVIDER_SITE_OTHER): Payer: Medicare Other | Admitting: Family Medicine

## 2012-07-31 VITALS — BP 112/80 | HR 79 | Temp 98.4°F | Wt 225.0 lb

## 2012-07-31 DIAGNOSIS — Z8673 Personal history of transient ischemic attack (TIA), and cerebral infarction without residual deficits: Secondary | ICD-10-CM | POA: Diagnosis not present

## 2012-07-31 DIAGNOSIS — E559 Vitamin D deficiency, unspecified: Secondary | ICD-10-CM

## 2012-07-31 DIAGNOSIS — E785 Hyperlipidemia, unspecified: Secondary | ICD-10-CM | POA: Diagnosis not present

## 2012-07-31 LAB — POCT URINALYSIS DIPSTICK
Glucose, UA: NEGATIVE
Ketones, UA: NEGATIVE
Spec Grav, UA: <=1.005

## 2012-07-31 LAB — CBC WITH DIFFERENTIAL/PLATELET
Basophils Relative: 0.7 % (ref 0.0–3.0)
Eosinophils Relative: 4.9 % (ref 0.0–5.0)
HCT: 44.3 % (ref 39.0–52.0)
Hemoglobin: 15.1 g/dL (ref 13.0–17.0)
Lymphs Abs: 1.9 10*3/uL (ref 0.7–4.0)
MCV: 88.9 fl (ref 78.0–100.0)
Monocytes Relative: 7.4 % (ref 3.0–12.0)
Neutro Abs: 4 10*3/uL (ref 1.4–7.7)
WBC: 6.8 10*3/uL (ref 4.5–10.5)

## 2012-07-31 LAB — BASIC METABOLIC PANEL
Calcium: 9.5 mg/dL (ref 8.4–10.5)
Chloride: 106 mEq/L (ref 96–112)
Creatinine, Ser: 1.2 mg/dL (ref 0.4–1.5)
Sodium: 141 mEq/L (ref 135–145)

## 2012-07-31 LAB — HEPATIC FUNCTION PANEL
ALT: 44 U/L (ref 0–53)
Alkaline Phosphatase: 65 U/L (ref 39–117)
Bilirubin, Direct: 0.1 mg/dL (ref 0.0–0.3)
Total Bilirubin: 0.9 mg/dL (ref 0.3–1.2)

## 2012-07-31 LAB — LDL CHOLESTEROL, DIRECT: Direct LDL: 153.8 mg/dL

## 2012-07-31 LAB — LIPID PANEL
Cholesterol: 224 mg/dL — ABNORMAL HIGH (ref 0–200)
Total CHOL/HDL Ratio: 7
VLDL: 48.6 mg/dL — ABNORMAL HIGH (ref 0.0–40.0)

## 2012-07-31 MED ORDER — TEMAZEPAM 30 MG PO CAPS: 30.0000 mg | ORAL_CAPSULE | Freq: Every evening | ORAL | Status: DC | PRN

## 2012-07-31 NOTE — Progress Notes (Signed)
  Subjective:    Patient ID: Bernard James, male    DOB: Dec 02, 1960, 51 y.o.   MRN: [id number]  HPI Here to follow up on a hx of cerebellar stroke and for a flu shot. He has been doing fairly well. No new neurologic deficits.    Review of Systems  Constitutional: Negative.   Respiratory: Negative.   Cardiovascular: Negative.   Neurological: Positive for dizziness. Negative for tremors, seizures, syncope, facial asymmetry, speech difficulty, weakness, light-headedness, numbness and headaches.  Psychiatric/Behavioral: Negative for confusion, dysphoric mood, decreased concentration and agitation. The patient is nervous/anxious. The patient is not hyperactive.        Objective:   Physical Exam  Constitutional: He is oriented to person, place, and time. He appears well-developed and well-nourished.  Cardiovascular: Normal rate, regular rhythm, normal heart sounds and intact distal pulses.   Pulmonary/Chest: Effort normal and breath sounds normal.  Neurological: He is alert and oriented to person, place, and time. He has normal reflexes. No cranial nerve deficit. He exhibits normal muscle tone. Coordination normal.          Assessment & Plan:  He had previously seen Neurologists at [facility] but he wishes to get established with someone here. We will set this up

## 2012-08-02 LAB — TSH: TSH: 2.35 u[IU]/mL (ref 0.35–5.50)

## 2012-08-21 ENCOUNTER — Other Ambulatory Visit: Payer: Self-pay | Admitting: Family Medicine

## 2012-08-21 NOTE — Telephone Encounter (Signed)
Call in #90 with 5 rf 

## 2012-08-28 DIAGNOSIS — I7774 Dissection of vertebral artery: Secondary | ICD-10-CM | POA: Diagnosis not present

## 2012-08-28 DIAGNOSIS — I6350 Cerebral infarction due to unspecified occlusion or stenosis of unspecified cerebral artery: Secondary | ICD-10-CM | POA: Diagnosis not present

## 2012-08-29 DIAGNOSIS — M722 Plantar fascial fibromatosis: Secondary | ICD-10-CM | POA: Diagnosis not present

## 2012-09-04 ENCOUNTER — Other Ambulatory Visit: Payer: Self-pay | Admitting: Neurology

## 2012-09-04 DIAGNOSIS — I7774 Dissection of vertebral artery: Secondary | ICD-10-CM

## 2012-09-04 DIAGNOSIS — I639 Cerebral infarction, unspecified: Secondary | ICD-10-CM

## 2012-09-10 ENCOUNTER — Ambulatory Visit
Admission: RE | Admit: 2012-09-10 | Discharge: 2012-09-10 | Disposition: A | Discharge status: Home / Self Care | Payer: Medicare Other | Source: Ambulatory Visit | Attending: Neurology | Admitting: Neurology

## 2012-09-10 DIAGNOSIS — I7789 Other specified disorders of arteries and arterioles: Secondary | ICD-10-CM | POA: Diagnosis not present

## 2012-09-10 DIAGNOSIS — I639 Cerebral infarction, unspecified: Secondary | ICD-10-CM

## 2012-09-10 DIAGNOSIS — I7774 Dissection of vertebral artery: Secondary | ICD-10-CM

## 2012-09-10 MED ORDER — IOHEXOL 350 MG/ML SOLN
100.0000 mL | Freq: Once | INTRAVENOUS | Status: AC | PRN
  Administered 2012-09-10: 100 mL via INTRAVENOUS

## 2012-11-29 ENCOUNTER — Telehealth: Payer: Self-pay | Admitting: Family Medicine

## 2012-11-29 NOTE — Telephone Encounter (Signed)
Patient Information:  Caller Name: Raijon  Phone: [phone]  Patient: Bernard James, Bernard James  Gender: Male  DOB: 08-13-1961  Age: 51 Years  PCP: Gershon Crane [facility])  Office Follow Up:  Does the office need to follow up with this patient?: No  Instructions For The Office: N/A  RN Note:  Home care instructions and call back parameter reviewed.with patient . Undestanding expressed.  Symptoms  Reason For Call & Symptoms: Onset Monday 11/25/12 of vomtiing.  He only vomited x2 that day. He had no diarrhea but did have "stomach rumbling". He felt feverish on Monday and Tuesday 2/17 and 2/18 - best described as body aches.   He describes having no energy.  Tuesday and Wednesay appetite picked up  .  He has felt wiped out but better everyday.  No fever, no cough, no runny nose, no N/V/D. He is eating and drinking normally.  Reviewed Health History In EMR: Yes  Reviewed Medications In EMR: Yes  Reviewed Allergies In EMR: Yes  Reviewed Surgeries / Procedures: No  Date of Onset of Symptoms: 11/25/2012  Treatments Tried: Asa daily, flonase, tylenol  Treatments Tried Worked: No  Guideline(s) Used:  Influenza Exposure  Influenza - Seasonal  Disposition Per Guideline:   Home Care  Reason For Disposition Reached:   Probable influenza with no complications and not HIGH RISK  Advice Given:  Reassurance  The treatment of influenza depends on your main symptoms. Generally, treatment is the same as for other viral respiratory infections (colds). Bed rest is unnecessary.  Here is some care advice that should help.  Treating the Symptoms of Flu  Fever, Muscle Aches, and Headache: For fever more than 101 F (38.3 C), muscle aches, and headaches, take acetaminophen every 4-6 hours (Adults 650 mg) OR ibuprofen every 6-8 hours (Adults 400-600 mg).  Hydrate: Drink extra liquids. If the air in your home is dry, use a humidifier.  Call Back If:  Fever lasts more than 3 days  Cough lasts more  than 3 weeks  You become short of breath or worse.

## 2013-02-17 ENCOUNTER — Other Ambulatory Visit: Payer: Self-pay | Admitting: Family Medicine

## 2013-02-18 ENCOUNTER — Telehealth: Payer: Self-pay | Admitting: Family Medicine

## 2013-02-18 NOTE — Telephone Encounter (Signed)
Call in #90 with 5 rf 

## 2013-02-18 NOTE — Telephone Encounter (Signed)
Refill request for Alprazolam 0.25 mg take 1 po tid prn.

## 2013-02-18 NOTE — Telephone Encounter (Signed)
Pt is out °

## 2013-02-19 MED ORDER — ALPRAZOLAM 0.25 MG PO TABS: 0.2500 mg | ORAL_TABLET | Freq: Three times a day (TID) | ORAL | Status: DC | PRN

## 2013-02-19 NOTE — Telephone Encounter (Signed)
I called in script 

## 2013-02-19 NOTE — Telephone Encounter (Signed)
Call in #90 with 5 rf 

## 2013-03-25 ENCOUNTER — Ambulatory Visit (INDEPENDENT_AMBULATORY_CARE_PROVIDER_SITE_OTHER): Payer: Medicare Other | Admitting: Family Medicine

## 2013-03-25 ENCOUNTER — Encounter: Payer: Self-pay | Admitting: Family Medicine

## 2013-03-25 ENCOUNTER — Telehealth: Payer: Self-pay | Admitting: Family Medicine

## 2013-03-25 VITALS — BP 128/78 | HR 108 | Temp 98.6°F | Wt 222.0 lb

## 2013-03-25 DIAGNOSIS — R5381 Other malaise: Secondary | ICD-10-CM | POA: Diagnosis not present

## 2013-03-25 DIAGNOSIS — T148 Other injury of unspecified body region: Secondary | ICD-10-CM

## 2013-03-25 DIAGNOSIS — R5383 Other fatigue: Secondary | ICD-10-CM

## 2013-03-25 DIAGNOSIS — R531 Weakness: Secondary | ICD-10-CM

## 2013-03-25 DIAGNOSIS — W57XXXA Bitten or stung by nonvenomous insect and other nonvenomous arthropods, initial encounter: Secondary | ICD-10-CM | POA: Diagnosis not present

## 2013-03-25 LAB — CBC WITH DIFFERENTIAL/PLATELET
Basophils Absolute: 0.1 10*3/uL (ref 0.0–0.1)
Eosinophils Absolute: 0.3 10*3/uL (ref 0.0–0.7)
HCT: 44.1 % (ref 39.0–52.0)
Lymphs Abs: 1.5 10*3/uL (ref 0.7–4.0)
MCHC: 34.9 g/dL (ref 30.0–36.0)
MCV: 88.3 fl (ref 78.0–100.0)
Monocytes Absolute: 0.6 10*3/uL (ref 0.1–1.0)
Platelets: 220 10*3/uL (ref 150.0–400.0)
RDW: 13.6 % (ref 11.5–14.6)

## 2013-03-25 LAB — TSH: TSH: 3.68 u[IU]/mL (ref 0.35–5.50)

## 2013-03-25 MED ORDER — MUPIROCIN CALCIUM 2 % EX CREA: TOPICAL_CREAM | Freq: Three times a day (TID) | CUTANEOUS | Status: DC

## 2013-03-25 MED ORDER — TRETINOIN 0.1 % EX CREA: TOPICAL_CREAM | Freq: Every day | CUTANEOUS | Status: AC

## 2013-03-25 MED ORDER — CLINDAMYCIN PHOSPHATE 1 % EX GEL: Freq: Two times a day (BID) | CUTANEOUS | Status: AC

## 2013-03-25 MED ORDER — DOXYCYCLINE HYCLATE 100 MG PO CAPS: 100.0000 mg | ORAL_CAPSULE | Freq: Two times a day (BID) | ORAL | Status: AC

## 2013-03-25 NOTE — Telephone Encounter (Signed)
He does not need to put anything on the rash areas.

## 2013-03-25 NOTE — Telephone Encounter (Signed)
Caller: Kalven/Patient; Phone: [phone]; Reason for Call: Seen in office today 6/17 about 11 am following tick bite with 2 area of red rash.  Is starting Doxycycline.  Happened to think on way home that he can't remember instructions about anything to put on red rash areas.  Asking what he should put on the 2 rash areas.   Please review and contact patient at [phone].

## 2013-03-25 NOTE — Progress Notes (Signed)
  Subjective:    Patient ID: Bernard James, male    DOB: May 11, 1961, 52 y.o.   MRN: [id number]  HPI Here for two tick bites. He discovered the tick about one week ago and pulled it out. Since then he has had a tender rash on both sites. He feels fine in general.    Review of Systems  Constitutional: Positive for fatigue. Negative for fever.  Respiratory: Negative.   Cardiovascular: Negative.   Skin: Positive for rash.       Objective:   Physical Exam  Constitutional: He is oriented to person, place, and time. He appears well-developed and well-nourished.  Cardiovascular: Normal rate, regular rhythm, normal heart sounds and intact distal pulses.   Pulmonary/Chest: Effort normal and breath sounds normal.  Neurological: He is alert and oriented to person, place, and time.  Skin:  He has 2 areas with macular red rash, one below the left buttock and one on the left calf. These areas are warm and slightly tender           Assessment & Plan:  Cover with Doxycycline.

## 2013-03-26 NOTE — Telephone Encounter (Signed)
Left message on machine for patient

## 2013-03-26 NOTE — Progress Notes (Signed)
Quick Note:  I released results in my chart. ______ 

## 2013-04-07 DIAGNOSIS — Q6650 Congenital pes planus, unspecified foot: Secondary | ICD-10-CM | POA: Diagnosis not present

## 2013-04-14 ENCOUNTER — Telehealth: Payer: Self-pay | Admitting: Family Medicine

## 2013-04-14 NOTE — Telephone Encounter (Signed)
Patient Information:  Caller Name: Elliot  Phone: [phone]  Patient: Bernard James, Bernard James  Gender: Male  DOB: 04-07-1961  Age: 52 Years  PCP: Gershon Crane [facility])  Office Follow Up:  Does the office need to follow up with this patient?: Yes  Instructions For The Office: OFFICE PLEASE REVIEW REGARDING PT REQUEST FOR FLORISTOR PROBIOTICS.  Pt completed recent antibiotics and would like to have probiotics for stoamch "not feeling normal."   Symptoms  Reason For Call & Symptoms: Pt is calling and states that he took antibiotics for a tick bite; completed the antibiotic approx 1 week ago;  pt states that the antibiotics have caused stomach problems;  unsure when sx started; states may have started while taking or right after completeing the antibiotics;  he takes OTC probiotics but he is requesting a Rx for probiotics; pt is requesting  Florastor or something better that MD would recommend to help with stomach issues; sx include weak feeling in stomach and soft stools; denies diarrhea; no vomiting;  very minimal nausea noted; states sx are very vague;  " I just know my stomach doesn't feel as good since taking the antibiotic;" no fever;  [facility] [phone]  Reviewed Health History In EMR: Yes  Reviewed Medications In EMR: Yes  Reviewed Allergies In EMR: Yes  Reviewed Surgeries / Procedures: Yes  Date of Onset of Symptoms: Unknown  Guideline(s) Used:  No Protocol Available - Sick Adult  Disposition Per Guideline:   Discuss with PCP and Callback by Nurse Today  Reason For Disposition Reached:   Nursing judgment  Advice Given:  Call Back If:  You become worse.  Patient Will Follow Care Advice:  YES

## 2013-04-14 NOTE — Telephone Encounter (Signed)
All probiotics, including Florastor, are now OTC. I agree for him to try these

## 2013-04-14 NOTE — Telephone Encounter (Signed)
I spoke with pt  

## 2013-04-21 DIAGNOSIS — J45909 Unspecified asthma, uncomplicated: Secondary | ICD-10-CM | POA: Diagnosis not present

## 2013-04-28 DIAGNOSIS — J209 Acute bronchitis, unspecified: Secondary | ICD-10-CM | POA: Diagnosis not present

## 2013-04-28 DIAGNOSIS — R05 Cough: Secondary | ICD-10-CM | POA: Diagnosis not present

## 2013-05-08 ENCOUNTER — Ambulatory Visit: Payer: Self-pay | Admitting: Unknown Physician Specialty

## 2013-05-08 DIAGNOSIS — R059 Cough, unspecified: Secondary | ICD-10-CM | POA: Diagnosis not present

## 2013-05-08 DIAGNOSIS — R05 Cough: Secondary | ICD-10-CM | POA: Diagnosis not present

## 2013-07-11 ENCOUNTER — Telehealth: Payer: Self-pay | Admitting: Family Medicine

## 2013-07-11 MED ORDER — FLUTICASONE PROPIONATE 50 MCG/ACT NA SUSP: 2.0000 | Freq: Every day | NASAL | Status: DC | PRN

## 2013-07-11 NOTE — Telephone Encounter (Signed)
Refill request for Flonase nasal spray and I did send e-scribe.

## 2013-08-14 ENCOUNTER — Other Ambulatory Visit: Payer: Self-pay

## 2013-08-20 ENCOUNTER — Other Ambulatory Visit: Payer: Self-pay | Admitting: Family Medicine

## 2013-08-21 ENCOUNTER — Ambulatory Visit (INDEPENDENT_AMBULATORY_CARE_PROVIDER_SITE_OTHER): Payer: Medicare Other | Admitting: Family Medicine

## 2013-08-21 DIAGNOSIS — Z23 Encounter for immunization: Secondary | ICD-10-CM | POA: Diagnosis not present

## 2013-08-22 NOTE — Telephone Encounter (Signed)
Call in #90 with 5 rf 

## 2013-09-17 ENCOUNTER — Telehealth: Payer: Self-pay | Admitting: Family Medicine

## 2013-09-17 NOTE — Telephone Encounter (Signed)
Pt states his medicare plan will not pay for ALPRAZolam (XANAX) 0.25 MG tablet next year. Pt is trying to figure out a way he can save money next year.  Pt would like to know if he could get an increased dose and cut in half on a new rx. Or if dr fry has any suggestions.

## 2013-09-18 NOTE — Telephone Encounter (Signed)
Yes we can prescribe a 0.5 mg tablet and he can cut these. Contact us after the first of the year

## 2013-09-19 NOTE — Telephone Encounter (Signed)
I spoke with pt and he is going to check with insurance to find out if the below dose is going to be cost effective.

## 2013-09-22 ENCOUNTER — Telehealth: Payer: Self-pay | Admitting: Family Medicine

## 2013-09-22 NOTE — Telephone Encounter (Signed)
Pt states he spoke with Nettie Elm on Friday and would follow up with her on today.  Pt requesting call back to follow-up.

## 2013-09-25 NOTE — Telephone Encounter (Signed)
I left a voice message for pt to call back. I have been out of the office for a couple of days.

## 2013-09-26 NOTE — Telephone Encounter (Signed)
I spoke with pt and he is going to continue with his current dose of Xanax and he has refills.

## 2014-02-14 IMAGING — CT CT ANGIO NECK
1 of 8 series · 3 of 33 positions shown · IV contrast ([ID] OMNI 350)
Comparison: Prior CTA from [facility]OS[facility] on
12/25/2007.

CLINICAL DATA: History of vertebral dissection.  History of
stroke.

CT ANGIOGRAPHY NECK
TECHNIQUE: Multidetector CT imaging of the neck was performed
using the standard protocol during bolus administration of
intravenous contrast.  Multiplanar CT image reconstructions
including MIPs were obtained to evaluate the vascular anatomy.
Carotid stenosis measurements (when applicable) are obtained
utilizing NASCET criteria, using the distal internal carotid
diameter as the denominator.
Contrast: 100mL OMNIPAQUE IOHEXOL 350 MG/ML SOLN

[Series 5: cta neck · axial · 0.35mm/px · z∈[-279,-14]mm · 3 of 107 slices shown]
[im 1/107  soft-tissue]
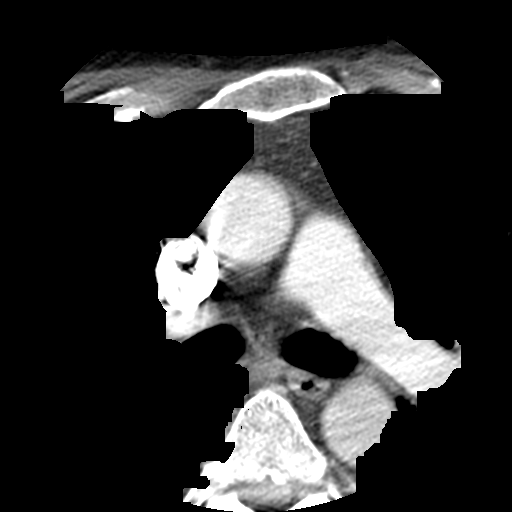
[im 54/107  bone]
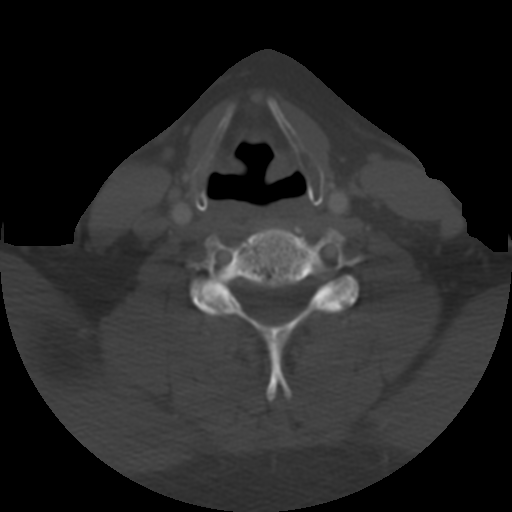
[im 107/107  soft-tissue]
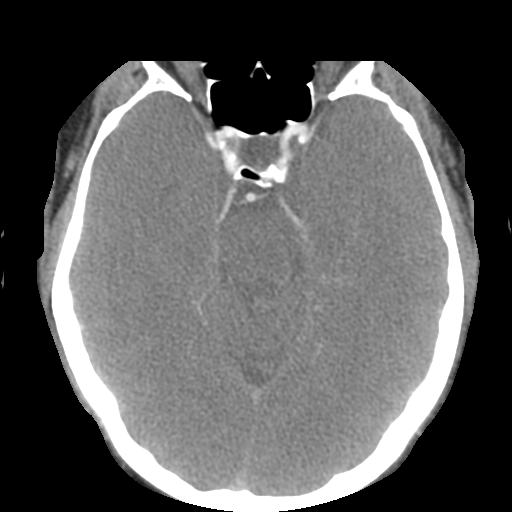

[3 of 33 positions shown; findings below may reference images not displayed]

CD is available to me however I am not able to load
the images due to an error message.

Comparison with the catheter angiogram report from 09/29/1999
FINDINGS: Lung apices are clear.  No mass or adenopathy in the
neck.  The visualized intracranial contents are normal.  Visualized
paranasal sinuses are clear.

Standard branching of the proximal great vessels.  No significant
atherosclerotic disease is present.  No plaque or calcification is
present.

Right vertebral:  Fusiform dilatation of the proximal right
vertebral artery extending over approximately 5 cm.  No significant
stenosis.  No intimal flap.  Vertebral artery above this level is
tortuous but without stenosis.  This was described on the prior
catheter angiogram report from 2444.

Left vertebral:  Mild fusiform dilatation of the proximal left
vertebral artery over approximately 3 cm. This was described on the
prior angiogram report.  No evidence of dissection or intimal flap.
No significant stenosis.  The remainder of the vertebral artery is
patent with tortuosity present.  Both vertebral arteries are patent
to the basilar.

Right carotid:  Right carotid artery is widely patent without
significant stenosis or plaque.  No evidence of carotid dissection.
Right internal carotid artery has tortuosity.

Left carotid:  Left carotid artery is widely patent without
stenosis or dissection.  No evidence of pseudoaneurysm.

 Review of the MIP images confirms the above findings.
IMPRESSION: Fusiform dilatation of the proximal vertebral arteries, right
greater than left.  This may be due to healed dissection.  This was
described on the angiogram from 2444 and appears to be similar
based on the report.  No intimal flap or dissection is seen at this
time.

No significant carotid stenosis or dissection.

## 2014-02-20 DIAGNOSIS — H5210 Myopia, unspecified eye: Secondary | ICD-10-CM | POA: Diagnosis not present

## 2014-03-05 ENCOUNTER — Other Ambulatory Visit: Payer: Self-pay | Admitting: Family Medicine

## 2014-03-06 NOTE — Telephone Encounter (Signed)
Call in #90 with 5 rf 

## 2014-06-04 ENCOUNTER — Telehealth: Payer: Self-pay | Admitting: Family Medicine

## 2014-06-04 ENCOUNTER — Emergency Department: Payer: Self-pay | Admitting: Emergency Medicine

## 2014-06-04 DIAGNOSIS — J3489 Other specified disorders of nose and nasal sinuses: Secondary | ICD-10-CM | POA: Diagnosis not present

## 2014-06-04 DIAGNOSIS — Z7982 Long term (current) use of aspirin: Secondary | ICD-10-CM | POA: Diagnosis not present

## 2014-06-04 DIAGNOSIS — F411 Generalized anxiety disorder: Secondary | ICD-10-CM | POA: Diagnosis not present

## 2014-06-04 DIAGNOSIS — Z79899 Other long term (current) drug therapy: Secondary | ICD-10-CM | POA: Diagnosis not present

## 2014-06-04 DIAGNOSIS — R5381 Other malaise: Secondary | ICD-10-CM | POA: Diagnosis not present

## 2014-06-04 LAB — BASIC METABOLIC PANEL
ANION GAP: 9 (ref 7–16)
BUN: 13 mg/dL (ref 7–18)
CALCIUM: 9.1 mg/dL (ref 8.5–10.1)
CHLORIDE: 106 mmol/L (ref 98–107)
CO2: 26 mmol/L (ref 21–32)
CREATININE: 1.22 mg/dL (ref 0.60–1.30)
EGFR (African American): >60
EGFR (Non-African Amer.): >60
Glucose: 98 mg/dL (ref 65–99)
OSMOLALITY: 281 (ref 275–301)
Potassium: 3.8 mmol/L (ref 3.5–5.1)
Sodium: 141 mmol/L (ref 136–145)

## 2014-06-04 LAB — CBC WITH DIFFERENTIAL/PLATELET
Basophil #: 0.1 10*3/uL (ref 0.0–0.1)
Basophil %: 1.1 %
EOS ABS: 0.1 10*3/uL (ref 0.0–0.7)
Eosinophil %: 2.6 %
HCT: 43.7 % (ref 40.0–52.0)
HGB: 14.8 g/dL (ref 13.0–18.0)
Lymphocyte #: 0.8 10*3/uL — ABNORMAL LOW (ref 1.0–3.6)
Lymphocyte %: 14.5 %
MCH: 30.2 pg (ref 26.0–34.0)
MCHC: 34 g/dL (ref 32.0–36.0)
MCV: 89 fL (ref 80–100)
Monocyte #: 0.3 x10 3/mm (ref 0.2–1.0)
Monocyte %: 5.2 %
NEUTROS ABS: 4.1 10*3/uL (ref 1.4–6.5)
NEUTROS PCT: 76.6 %
Platelet: 192 10*3/uL (ref 150–440)
RBC: 4.92 10*6/uL (ref 4.40–5.90)
RDW: 13.2 % (ref 11.5–14.5)
WBC: 5.3 10*3/uL (ref 3.8–10.6)

## 2014-06-04 LAB — URINALYSIS, COMPLETE
BACTERIA: NONE SEEN
Bilirubin,UR: NEGATIVE
Blood: NEGATIVE
Glucose,UR: NEGATIVE mg/dL (ref 0–75)
KETONE: NEGATIVE
Leukocyte Esterase: NEGATIVE
Nitrite: NEGATIVE
PH: 7 (ref 4.5–8.0)
PROTEIN: NEGATIVE
RBC,UR: NONE SEEN /HPF (ref 0–5)
SPECIFIC GRAVITY: 1.002 (ref 1.003–1.030)
SQUAMOUS EPITHELIAL: NONE SEEN
WBC UR: NONE SEEN /HPF (ref 0–5)

## 2014-06-04 LAB — TROPONIN I

## 2014-06-04 NOTE — Telephone Encounter (Signed)
Noted  

## 2014-06-04 NOTE — Telephone Encounter (Signed)
Patient Information:  Caller Name: Knut  Phone: [phone]  Patient: Bernard James, Bernard James  Gender: Male  DOB: [birth year]  Age: 53 Years  PCP: Alysia Penna [facility])  Office Follow Up:  Does the office need to follow up with this patient?: No  Instructions For The Office: N/A   Symptoms  Reason For Call & Symptoms: Pt thinks he is having BG problems.  States he "feels [place] [employer] [place]",  [id number]. dizzy and lightheaded.  Unable to schedule in EPIC and pt needs to be seen today.  He is going to [facility] ([place] is drinking water and has a driver. Called and gave report to triage nurse.   Reviewed Health History In EMR: Yes  Reviewed Medications In EMR: Yes  Reviewed Allergies In EMR: Yes  Reviewed Surgeries / Procedures: Yes  Date of Onset of Symptoms: 06/04/2014  Guideline(s) Used:  Diabetes - High Blood Sugar  Disposition Per Guideline:   See Today in Office  Reason For Disposition Reached:   Patient wants to be seen  Advice Given:  N/A  Patient Will Follow Care Advice:  YES

## 2014-06-16 ENCOUNTER — Telehealth: Payer: Self-pay | Admitting: Family Medicine

## 2014-06-16 NOTE — Telephone Encounter (Signed)
Pt states he has loose stool for 5 days. Feeling sick/ nauseous, eyes achey, just not feeling well. not sure what is is, but prefers to see dr fry. Only one SD appt on wed.

## 2014-06-17 ENCOUNTER — Ambulatory Visit: Payer: Medicare Other | Admitting: Family Medicine

## 2014-06-17 NOTE — Telephone Encounter (Signed)
Pt states he is feeling better this morning, but if he gets worse he will cb.

## 2014-06-17 NOTE — Telephone Encounter (Signed)
Okay to schedule

## 2014-06-23 ENCOUNTER — Encounter: Payer: Self-pay | Admitting: Family Medicine

## 2014-06-23 ENCOUNTER — Ambulatory Visit (INDEPENDENT_AMBULATORY_CARE_PROVIDER_SITE_OTHER): Payer: Medicare Other | Admitting: Family Medicine

## 2014-06-23 VITALS — BP 119/70 | HR 77 | Temp 98.6°F | Ht 76.0 in | Wt 219.0 lb

## 2014-06-23 DIAGNOSIS — A084 Viral intestinal infection, unspecified: Secondary | ICD-10-CM

## 2014-06-23 DIAGNOSIS — Z23 Encounter for immunization: Secondary | ICD-10-CM

## 2014-06-23 DIAGNOSIS — A088 Other specified intestinal infections: Secondary | ICD-10-CM | POA: Diagnosis not present

## 2014-06-23 MED ORDER — CLINDAMYCIN PHOSPHATE 1 % EX GEL: Freq: Two times a day (BID) | CUTANEOUS | Status: DC

## 2014-06-23 MED ORDER — MUPIROCIN CALCIUM 2 % EX CREA: TOPICAL_CREAM | Freq: Three times a day (TID) | CUTANEOUS | Status: DC

## 2014-06-23 MED ORDER — TRETINOIN MICROSPHERE 0.1 % EX GEL: Freq: Every day | CUTANEOUS | Status: DC

## 2014-06-23 MED ORDER — MUPIROCIN CALCIUM 2 % EX CREA
TOPICAL_CREAM | Freq: Three times a day (TID) | CUTANEOUS | Status: DC
Start: 2014-06-23 — End: 2023-08-23

## 2014-06-23 NOTE — Addendum Note (Signed)
Addended by: Alysia Penna A on: 06/23/2014 02:48 PM   Modules accepted: Orders

## 2014-06-23 NOTE — Progress Notes (Signed)
Pre visit review using our clinic review tool, if applicable. No additional management support is needed unless otherwise documented below in the visit note. 

## 2014-06-23 NOTE — Progress Notes (Signed)
   Subjective:    Patient ID: Bernard James, male    DOB: 1961/04/25, 53 y.o.   MRN: [id number]  HPI Here for 2 weeks of intermittent abdominal bloating, diarrhea, and fatigue. No fever or nausea. He feels better the past 2 days than he did.    Review of Systems  Constitutional: Positive for fatigue. Negative for fever.  Respiratory: Negative.   Cardiovascular: Negative.   Gastrointestinal: Positive for abdominal pain and diarrhea. Negative for nausea, vomiting, constipation, blood in stool, abdominal distention, anal bleeding and rectal pain.  Genitourinary: Negative.        Objective:   Physical Exam  Constitutional: He appears well-developed and well-nourished. No distress.  Cardiovascular: Normal rate, regular rhythm, normal heart sounds and intact distal pulses.   Pulmonary/Chest: Effort normal.  Abdominal: Soft. Bowel sounds are normal. He exhibits no distension and no mass. There is no tenderness. There is no rebound and no guarding.          Assessment & Plan:  This seems to be a viral gastroenteritis and he seems to be at the end of it. Rest, advance diet as tolerated. Recheck prn

## 2014-06-23 NOTE — Addendum Note (Signed)
Addended by: Aggie Hacker A on: 06/23/2014 02:52 PM   Modules accepted: Orders

## 2014-06-25 ENCOUNTER — Encounter: Payer: Self-pay | Admitting: Family Medicine

## 2014-06-25 DIAGNOSIS — L719 Rosacea, unspecified: Secondary | ICD-10-CM | POA: Insufficient documentation

## 2014-06-26 ENCOUNTER — Telehealth: Payer: Self-pay | Admitting: Family Medicine

## 2014-06-26 NOTE — Telephone Encounter (Signed)
Bernard James medicare called with approval   Approval for  tretinoin microspheres (RETIN-A MICRO) 0.1 % gel  Authorization for a year

## 2014-08-10 ENCOUNTER — Telehealth: Payer: Self-pay | Admitting: Family Medicine

## 2014-08-10 NOTE — Telephone Encounter (Signed)
Bernard James, Bernard James is requesting re-fill on fluticasone (FLONASE) 50 MCG/ACT nasal spray

## 2014-08-11 MED ORDER — FLUTICASONE PROPIONATE 50 MCG/ACT NA SUSP: 2.0000 | Freq: Every day | NASAL | Status: DC | PRN

## 2014-08-11 NOTE — Telephone Encounter (Signed)
I sent script e-scribe. 

## 2014-08-31 ENCOUNTER — Other Ambulatory Visit: Payer: Self-pay | Admitting: Family Medicine

## 2014-09-01 MED ORDER — ALPRAZOLAM 0.25 MG PO TABS: ORAL_TABLET | ORAL | Status: DC

## 2014-09-01 NOTE — Telephone Encounter (Signed)
I called in script 

## 2014-09-01 NOTE — Telephone Encounter (Signed)
Call in #90 with 5 rf 

## 2014-10-12 IMAGING — CR DG CHEST 2V
1 series · 2 of 2 positions shown · non-contrast
Comparison: none

REASON FOR EXAM: cough
COMMENTS:

PROCEDURE:     DXR - DXR CHEST PA (OR AP) AND LATERAL  - May 08, 2013  [DATE]
RESULT:     The lungs are clear. The heart and pulmonary vessels are normal.
The bony and mediastinal structures are unremarkable. There is no effusion.
There is no pneumothorax or evidence of congestive failure.

[Series 1: w chest pa · 0.14mm/px · 2 of 2 slices shown]
[im 1/2]
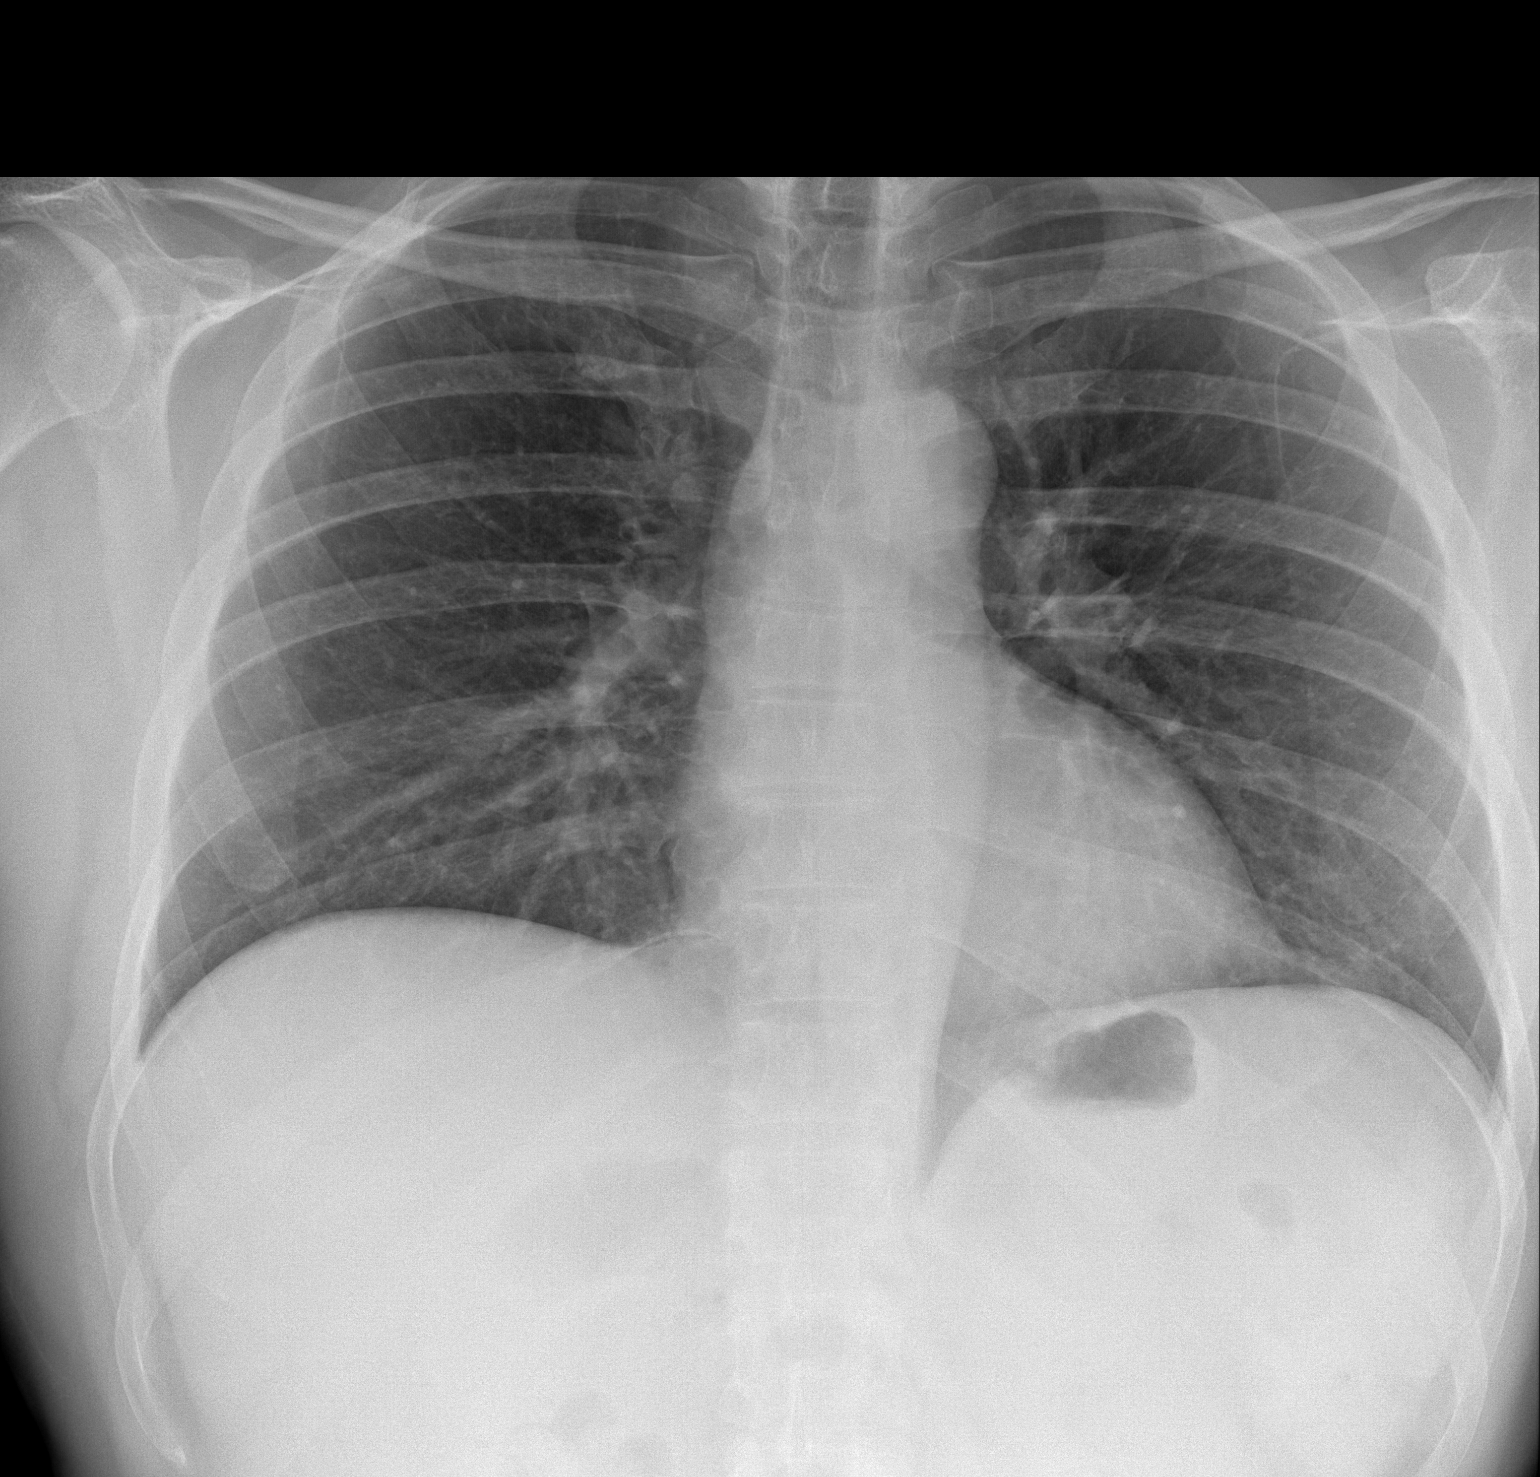
[im 2/2]
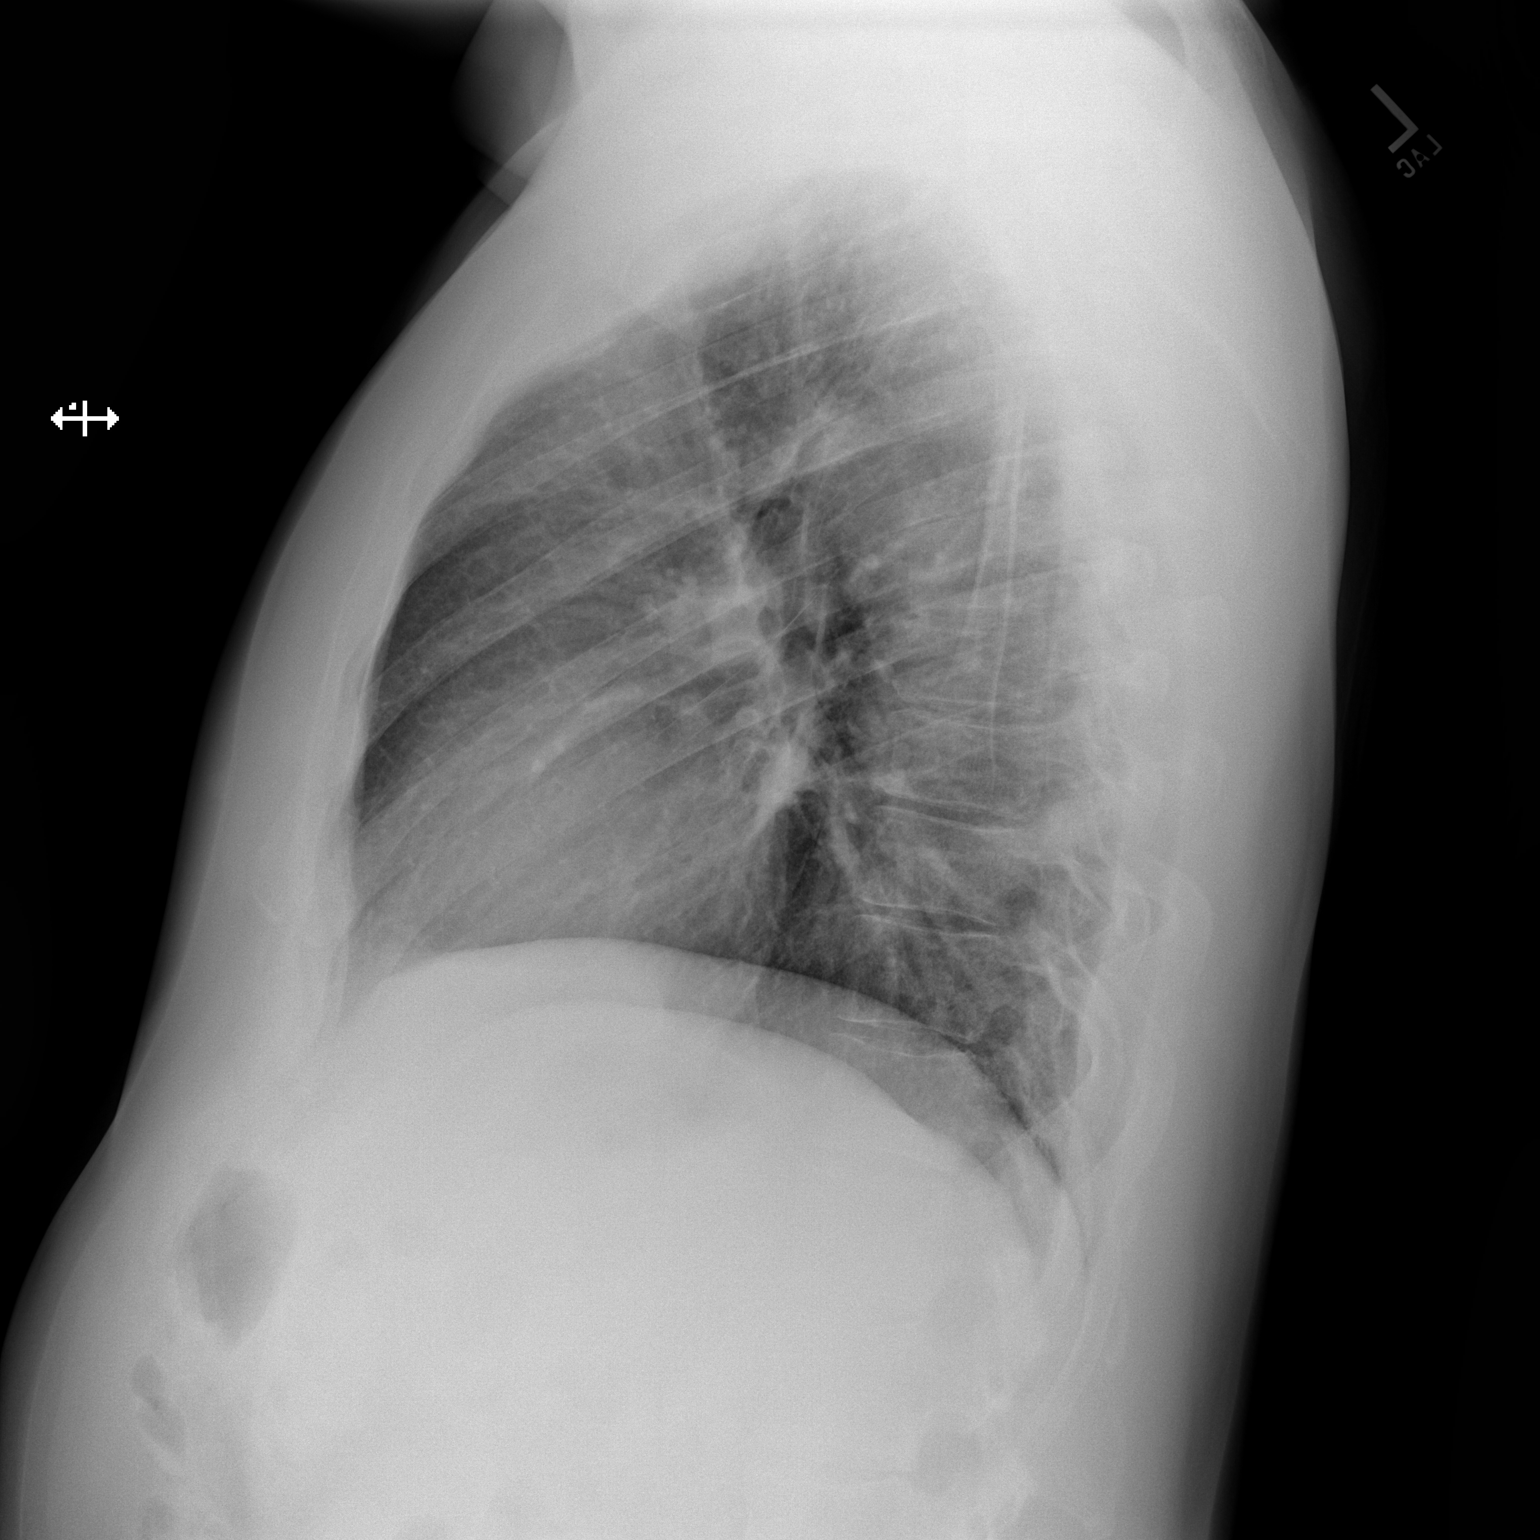

[2 of 2 positions shown; findings below may reference images not displayed]

IMPRESSION: No acute cardiopulmonary disease.

[REDACTED]

## 2014-12-02 ENCOUNTER — Telehealth: Payer: Self-pay | Admitting: Family Medicine

## 2014-12-02 NOTE — Telephone Encounter (Signed)
Are you working on a prior authorization for this?

## 2014-12-02 NOTE — Telephone Encounter (Signed)
Pt's insurance denied rx for alprazolam 0.25mg .  Bernard James is working with patient to get medication approved.  She needs to know the following:  How long has patient been prescribed this medication? Does he has any documented or known side effects to this medication? Has the patient tried and failed any other medication in this class? Would changing this medication be detrimental to the patient?

## 2014-12-03 NOTE — Telephone Encounter (Signed)
[name] from [facility] called to ask if pt no longer took  ALPRAZolam (XANAX) 0.25 MG tablet after all these years would he be at risk for adverse reactions or  withdrawal symptons. If you could answer this asap as she is on a very strict dead line.   [place] phone number [phone]

## 2014-12-04 NOTE — Telephone Encounter (Signed)
I left a voice message for Bernard James to return my call.

## 2014-12-04 NOTE — Telephone Encounter (Signed)
I spoke with [name] and went over the below questions. She is going to resubmit order for medication.

## 2015-03-04 ENCOUNTER — Other Ambulatory Visit: Payer: Self-pay | Admitting: Family Medicine

## 2015-03-05 NOTE — Telephone Encounter (Signed)
Call in #90 with 5 rf 

## 2015-03-24 ENCOUNTER — Encounter: Payer: Self-pay | Admitting: Family Medicine

## 2015-03-24 ENCOUNTER — Ambulatory Visit (INDEPENDENT_AMBULATORY_CARE_PROVIDER_SITE_OTHER): Payer: Medicare Other | Admitting: Family Medicine

## 2015-03-24 VITALS — BP 136/80 | HR 75 | Temp 98.4°F | Ht 76.0 in | Wt 225.0 lb

## 2015-03-24 DIAGNOSIS — R739 Hyperglycemia, unspecified: Secondary | ICD-10-CM

## 2015-03-24 DIAGNOSIS — R531 Weakness: Secondary | ICD-10-CM | POA: Diagnosis not present

## 2015-03-24 LAB — CBC WITH DIFFERENTIAL/PLATELET
BASOS ABS: 0 10*3/uL (ref 0.0–0.1)
Basophils Relative: 0.6 % (ref 0.0–3.0)
EOS PCT: 3.8 % (ref 0.0–5.0)
Eosinophils Absolute: 0.3 10*3/uL (ref 0.0–0.7)
HCT: 45.7 % (ref 39.0–52.0)
HEMOGLOBIN: 15.8 g/dL (ref 13.0–17.0)
LYMPHS PCT: 27.4 % (ref 12.0–46.0)
Lymphs Abs: 1.9 10*3/uL (ref 0.7–4.0)
MCHC: 34.5 g/dL (ref 30.0–36.0)
MCV: 89.1 fl (ref 78.0–100.0)
MONOS PCT: 6.4 % (ref 3.0–12.0)
Monocytes Absolute: 0.5 10*3/uL (ref 0.1–1.0)
NEUTROS ABS: 4.4 10*3/uL (ref 1.4–7.7)
Neutrophils Relative %: 61.8 % (ref 43.0–77.0)
PLATELETS: 234 10*3/uL (ref 150.0–400.0)
RBC: 5.13 Mil/uL (ref 4.22–5.81)
RDW: 12.9 % (ref 11.5–15.5)
WBC: 7.1 10*3/uL (ref 4.0–10.5)

## 2015-03-24 LAB — POCT URINALYSIS DIPSTICK
BILIRUBIN UA: NEGATIVE
GLUCOSE UA: NEGATIVE
Ketones, UA: NEGATIVE
Leukocytes, UA: NEGATIVE
Nitrite, UA: NEGATIVE
PH UA: 6
Protein, UA: NEGATIVE
RBC UA: NEGATIVE
Spec Grav, UA: 1.015
Urobilinogen, UA: 0.2

## 2015-03-24 LAB — HEPATIC FUNCTION PANEL
ALT: 33 U/L (ref 0–53)
AST: 19 U/L (ref 0–37)
Albumin: 4.6 g/dL (ref 3.5–5.2)
Alkaline Phosphatase: 63 U/L (ref 39–117)
BILIRUBIN DIRECT: 0.1 mg/dL (ref 0.0–0.3)
Total Bilirubin: 0.7 mg/dL (ref 0.2–1.2)
Total Protein: 6.9 g/dL (ref 6.0–8.3)

## 2015-03-24 LAB — BASIC METABOLIC PANEL
BUN: 12 mg/dL (ref 6–23)
CHLORIDE: 105 meq/L (ref 96–112)
CO2: 31 meq/L (ref 19–32)
CREATININE: 1.12 mg/dL (ref 0.40–1.50)
Calcium: 10.3 mg/dL (ref 8.4–10.5)
GFR: 72.98 mL/min (ref >60.00)
Glucose, Bld: 107 mg/dL — ABNORMAL HIGH (ref 70–99)
Potassium: 4.4 mEq/L (ref 3.5–5.1)
Sodium: 141 mEq/L (ref 135–145)

## 2015-03-24 LAB — VITAMIN B12: Vitamin B-12: 1032 pg/mL — ABNORMAL HIGH (ref 211–911)

## 2015-03-24 LAB — HEMOGLOBIN A1C: HEMOGLOBIN A1C: 4.7 % (ref 4.6–6.5)

## 2015-03-24 LAB — TSH: TSH: 2.62 u[IU]/mL (ref 0.35–4.50)

## 2015-03-24 NOTE — Progress Notes (Signed)
Pre visit review using our clinic review tool, if applicable. No additional management support is needed unless otherwise documented below in the visit note. 

## 2015-03-24 NOTE — Progress Notes (Signed)
   Subjective:    Patient ID: Bernard James, male    DOB: [birth year], 54 y.o.   MRN: [id number]  HPI Here for several episodes in the past week of suddenly feeling weak or lightheaded. He thinks his blood sugar is dropping. When this happens he usually drinks some OJ and this very quickly makes him feel better. No other sx.    Review of Systems  Respiratory: Negative.   Cardiovascular: Negative.   Gastrointestinal: Negative.   Neurological: Positive for weakness and light-headedness. Negative for dizziness, tremors, seizures, syncope, facial asymmetry, speech difficulty, numbness and headaches.       Objective:   Physical Exam  Constitutional: He is oriented to person, place, and time. He appears well-developed and well-nourished.  Neck: No thyromegaly present.  Cardiovascular: Normal rate, regular rhythm, normal heart sounds and intact distal pulses.   Pulmonary/Chest: Effort normal and breath sounds normal.  Lymphadenopathy:    He has no cervical adenopathy.  Neurological: He is alert and oriented to person, place, and time. He has normal reflexes. No cranial nerve deficit. He exhibits normal muscle tone. Coordination normal.          Assessment & Plan:  Episodic weakness which is suspicious for hypoglycemia. We will get labs today. I advised him to eat regular meals, get protein at every meal, and avoid concentrated sweets or heavy carbohydrate loads.

## 2015-04-29 ENCOUNTER — Telehealth: Payer: Self-pay | Admitting: Family Medicine

## 2015-04-29 NOTE — Telephone Encounter (Signed)
Pt needs his Desoximetasone cream 0.25% to be refilled. Pt uses the [facility] on [address] in [place]

## 2015-04-30 MED ORDER — DESOXIMETASONE 0.25 % EX CREA: 1.0000 "application " | TOPICAL_CREAM | Freq: Two times a day (BID) | CUTANEOUS | Status: DC

## 2015-04-30 NOTE — Telephone Encounter (Signed)
I sent script e-scribe. 

## 2015-04-30 NOTE — Telephone Encounter (Signed)
Call this in for 30 grams and 5 rf

## 2015-06-21 ENCOUNTER — Ambulatory Visit (INDEPENDENT_AMBULATORY_CARE_PROVIDER_SITE_OTHER): Payer: Medicare Other | Admitting: Family Medicine

## 2015-06-21 ENCOUNTER — Encounter: Payer: Self-pay | Admitting: Gastroenterology

## 2015-06-21 ENCOUNTER — Encounter: Payer: Self-pay | Admitting: Family Medicine

## 2015-06-21 VITALS — BP 133/85 | HR 77 | Temp 98.2°F | Ht 76.0 in | Wt 231.0 lb

## 2015-06-21 DIAGNOSIS — K5732 Diverticulitis of large intestine without perforation or abscess without bleeding: Secondary | ICD-10-CM

## 2015-06-21 DIAGNOSIS — Z0000 Encounter for general adult medical examination without abnormal findings: Secondary | ICD-10-CM

## 2015-06-21 MED ORDER — CIPROFLOXACIN HCL 500 MG PO TABS: 500.0000 mg | ORAL_TABLET | Freq: Two times a day (BID) | ORAL | Status: DC

## 2015-06-21 MED ORDER — METRONIDAZOLE 500 MG PO TABS: 500.0000 mg | ORAL_TABLET | Freq: Three times a day (TID) | ORAL | Status: DC

## 2015-06-21 NOTE — Progress Notes (Signed)
   Subjective:    Patient ID: Bernard James, male    DOB: 1961/02/08, 54 y.o.   MRN: [id number]  HPI Here for one week of LLQ cramping pains. His BMs are regualr and soft. No urinary symptoms. No nausea or fever.    Review of Systems  Constitutional: Negative.   Respiratory: Negative.   Cardiovascular: Negative.   Gastrointestinal: Positive for abdominal pain. Negative for nausea, vomiting, diarrhea, constipation, blood in stool, abdominal distention, anal bleeding and rectal pain.  Genitourinary: Negative.        Objective:   Physical Exam  Constitutional: He appears well-developed and well-nourished.  Cardiovascular: Normal rate, regular rhythm, normal heart sounds and intact distal pulses.   Pulmonary/Chest: Effort normal and breath sounds normal.  Abdominal: Soft. Bowel sounds are normal. He exhibits no distension and no mass. There is no rebound and no guarding.  Mildly tender in the LLQ          Assessment & Plan:  Diverticulitis. Treat with Flagyl and Cipro. Set up a colonoscopy sometime soon.

## 2015-06-21 NOTE — Progress Notes (Signed)
Pre visit review using our clinic review tool, if applicable. No additional management support is needed unless otherwise documented below in the visit note. 

## 2015-06-22 ENCOUNTER — Telehealth: Payer: Self-pay | Admitting: Family Medicine

## 2015-06-22 NOTE — Telephone Encounter (Signed)
Patient states that Metronidazole is making him dizzy and sleepy. Patient wants to know if the medication half or reduced due to the side effect.

## 2015-06-23 NOTE — Telephone Encounter (Signed)
Tell him to cut the Metronidazole dose in half

## 2015-06-23 NOTE — Telephone Encounter (Signed)
I spoke with pt  

## 2015-07-26 ENCOUNTER — Telehealth: Payer: Self-pay | Admitting: Family Medicine

## 2015-07-26 DIAGNOSIS — K219 Gastro-esophageal reflux disease without esophagitis: Secondary | ICD-10-CM

## 2015-07-26 NOTE — Telephone Encounter (Signed)
Pt states he has had stomach aggravation, belching , burping. Indigestion, especially after eating. Pt would like to know if dr Sarajane Jews will order an endoscopy as the same time as scheduled colonoscopy  on wed  Nov 16 at 3pm

## 2015-07-27 NOTE — Telephone Encounter (Signed)
Left message on personalized vice mail that referral was sent to referral coordinator and someone will be in touch.

## 2015-07-30 ENCOUNTER — Ambulatory Visit (AMBULATORY_SURGERY_CENTER): Payer: Self-pay

## 2015-07-30 VITALS — Ht 76.0 in | Wt 234.0 lb

## 2015-07-30 DIAGNOSIS — Z1211 Encounter for screening for malignant neoplasm of colon: Secondary | ICD-10-CM

## 2015-07-30 MED ORDER — SUPREP BOWEL PREP KIT 17.5-3.13-1.6 GM/177ML PO SOLN: 1.0000 | Freq: Once | ORAL | Status: DC

## 2015-07-30 NOTE — Progress Notes (Signed)
No allergies to eggs or soy No past problems with anesthesia No diet/weight loss meds No home oxygen  Has internet; refused emmi

## 2015-08-03 ENCOUNTER — Ambulatory Visit (INDEPENDENT_AMBULATORY_CARE_PROVIDER_SITE_OTHER): Payer: Medicare Other | Admitting: *Deleted

## 2015-08-03 DIAGNOSIS — Z23 Encounter for immunization: Secondary | ICD-10-CM

## 2015-08-09 ENCOUNTER — Encounter: Payer: Medicare Other | Admitting: Gastroenterology

## 2015-08-25 ENCOUNTER — Encounter: Payer: Self-pay | Admitting: Gastroenterology

## 2015-08-25 ENCOUNTER — Ambulatory Visit (AMBULATORY_SURGERY_CENTER): Payer: Medicare Other | Admitting: Gastroenterology

## 2015-08-25 VITALS — BP 121/74 | HR 65 | Temp 96.5°F | Resp 16 | Ht 76.0 in | Wt 234.0 lb

## 2015-08-25 DIAGNOSIS — Z1211 Encounter for screening for malignant neoplasm of colon: Secondary | ICD-10-CM

## 2015-08-25 DIAGNOSIS — E669 Obesity, unspecified: Secondary | ICD-10-CM | POA: Diagnosis not present

## 2015-08-25 DIAGNOSIS — K635 Polyp of colon: Secondary | ICD-10-CM

## 2015-08-25 DIAGNOSIS — Z8673 Personal history of transient ischemic attack (TIA), and cerebral infarction without residual deficits: Secondary | ICD-10-CM | POA: Diagnosis not present

## 2015-08-25 DIAGNOSIS — D128 Benign neoplasm of rectum: Secondary | ICD-10-CM | POA: Diagnosis not present

## 2015-08-25 DIAGNOSIS — D125 Benign neoplasm of sigmoid colon: Secondary | ICD-10-CM

## 2015-08-25 DIAGNOSIS — D123 Benign neoplasm of transverse colon: Secondary | ICD-10-CM | POA: Diagnosis not present

## 2015-08-25 DIAGNOSIS — R42 Dizziness and giddiness: Secondary | ICD-10-CM | POA: Diagnosis not present

## 2015-08-25 DIAGNOSIS — D129 Benign neoplasm of anus and anal canal: Secondary | ICD-10-CM

## 2015-08-25 DIAGNOSIS — D122 Benign neoplasm of ascending colon: Secondary | ICD-10-CM

## 2015-08-25 MED ORDER — SODIUM CHLORIDE 0.9 % IV SOLN: 500.0000 mL | INTRAVENOUS | Status: DC

## 2015-08-25 NOTE — Progress Notes (Signed)
Called to room to assist during endoscopic procedure.  Patient ID and intended procedure confirmed with present staff. Received instructions for my participation in the procedure from the performing physician.  

## 2015-08-25 NOTE — Patient Instructions (Signed)
YOU HAD AN ENDOSCOPIC PROCEDURE TODAY AT [facility]:   Refer to the procedure report that was given to you for any specific questions about what was found during the examination.  If the procedure report does not answer your questions, please call your gastroenterologist to clarify.  If you requested that your care partner not be given the details of your procedure findings, then the procedure report has been included in a sealed envelope for you to review at your convenience later.  YOU SHOULD EXPECT: Some feelings of bloating in the abdomen. Passage of more gas than usual.  Walking can help get rid of the air that was put into your GI tract during the procedure and reduce the bloating. If you had a lower endoscopy (such as a colonoscopy or flexible sigmoidoscopy) you may notice spotting of blood in your stool or on the toilet paper. If you underwent a bowel prep for your procedure, you may not have a normal bowel movement for a few days.  Please Note:  You might notice some irritation and congestion in your nose or some drainage.  This is from the oxygen used during your procedure.  There is no need for concern and it should clear up in a day or so.  SYMPTOMS TO REPORT IMMEDIATELY:   Following lower endoscopy (colonoscopy or flexible sigmoidoscopy):  Excessive amounts of blood in the stool  Significant tenderness or worsening of abdominal pains  Swelling of the abdomen that is new, acute  Fever of 100F or higher  For urgent or emergent issues, a gastroenterologist can be reached at any hour by calling [phone].   DIET: Your first meal following the procedure should be a small meal and then it is ok to progress to your normal diet. Heavy or fried foods are harder to digest and may make you feel nauseous or bloated.  Likewise, meals heavy in dairy and vegetables can increase bloating.  Drink plenty of fluids but you should avoid alcoholic beverages for 24  hours.  ACTIVITY:  You should plan to take it easy for the rest of today and you should NOT DRIVE or use heavy machinery until tomorrow (because of the sedation medicines used during the test).    FOLLOW UP: Our staff will call the number listed on your records the next business day following your procedure to check on you and address any questions or concerns that you may have regarding the information given to you following your procedure. If we do not reach you, we will leave a message.  However, if you are feeling well and you are not experiencing any problems, there is no need to return our call.  We will assume that you have returned to your regular daily activities without incident.  If any biopsies were taken you will be contacted by phone or by letter within the next 1-3 weeks.  Please call us at [phone] if you have not heard about the biopsies in 3 weeks.    SIGNATURES/CONFIDENTIALITY: You and/or your care partner have signed paperwork which will be entered into your electronic medical record.  These signatures attest to the fact that that the information above on your After Visit Summary has been reviewed and is understood.  Full responsibility of the confidentiality of this discharge information lies with you and/or your care-partner.  Polyps, diverticulosis, high fiber, hemorrhoids-handouts given  No aspirin, aspirin products, and NSAIDs (ibuprofen, motrin, aleve, naproxen, etc) for 2 weeks 09/08/15.  Follow-up in  office if hemorrhoids symptoms are persistent, for banding.

## 2015-08-25 NOTE — Progress Notes (Signed)
Report to PACU, RN, vss, BBS= Clear.  

## 2015-08-25 NOTE — Op Note (Signed)
[facility]  [address] [place], [id number]   COLONOSCOPY PROCEDURE REPORT  PATIENT: Bernard, James  MR#: [id number] BIRTHDATE: Dec 26, 1960 , 12  yrs. old GENDER: male ENDOSCOPIST: Yetta Flock, MD REFERRED BY: Alysia Penna, MD PROCEDURE DATE:  08/25/2015 PROCEDURE:   Colonoscopy, screening, Colonoscopy with snare polypectomy, and Colonoscopy with biopsy First Screening Colonoscopy - Avg.  risk and is 50 yrs.  old or older Yes.  Prior Negative Screening - Now for repeat screening. N/A  History of Adenoma - Now for follow-up colonoscopy & has been > or = to 3 yrs.  N/A  Polyps removed today? Yes ASA CLASS:   Class II INDICATIONS:Screening for colonic neoplasia and Colorectal Neoplasm Risk Assessment for this procedure is average risk. MEDICATIONS: Propofol 400 mg IV  DESCRIPTION OF PROCEDURE:   After the risks benefits and alternatives of the procedure were thoroughly explained, informed consent was obtained.  The digital rectal exam revealed external hemorrhoids.   The LB [id number]  endoscope was introduced through the anus and advanced to the cecum, which was identified by both the appendix and ileocecal valve. No adverse events experienced.   The quality of the prep was good.  The instrument was then slowly withdrawn as the colon was fully examined. Estimated blood loss is zero unless otherwise noted in this procedure report.      COLON FINDINGS: A roughly 8-83mm sessile polyp was noted in the ascending colon and removed with hot snare.  A diminutive sessile polyp was noted in the ascending colon and removed with cold forceps.  Four x 4-8mm sessile polyps were noted in the transverse colon and removed with cold snare.  A 46mm sessile polyp was noted in the sigmoid colon and removed with hot snare.  Two 3-30mm sessile polyps were noted in the sigmoid colon and removed with cold forceps.  A 14mm sessile rectal polyp was noted and removed via  hot snare.  Moderate diverticulosis was noted in the left colon. Retroflexed views revealed internal hemorrhoids. The time to cecum = 5.15 (including polypectomy) Withdrawal time = 22.3   The scope was withdrawn and the procedure completed. COMPLICATIONS: There were no immediate complications.  ENDOSCOPIC IMPRESSION: 10 colon polyps removed as outlined above Left sided diverticulosis Internal and external hemorrhoids  RECOMMENDATIONS: No aspirin or NSAIDs for 2 weeks post procedure Await pathology results Resume diet and medications High fiber diet for hemorrhoids noted on today's exam Follow up in the GI clinic for consideration for banding of hemorrhoids if you have persistent symptoms from these  eSigned:  Yetta Flock, MD 08/25/2015 3:48 PM   cc: Gerri Spore MD, the patient   PATIENT NAME:  Bernard, James MR#: [id number]

## 2015-08-26 ENCOUNTER — Telehealth: Payer: Self-pay | Admitting: Gastroenterology

## 2015-08-26 NOTE — Telephone Encounter (Signed)
°  Follow up Call-  Call back number 08/25/2015  Post procedure Call Back phone  # [phone]  Permission to leave phone message Yes     Patient was called at the number that was given. Mail box was full and unable to leave a message.

## 2015-09-04 ENCOUNTER — Encounter: Payer: Self-pay | Admitting: Gastroenterology

## 2015-09-08 ENCOUNTER — Other Ambulatory Visit: Payer: Self-pay | Admitting: Family Medicine

## 2015-09-10 NOTE — Telephone Encounter (Signed)
Call in #90 with 5 rf 

## 2015-09-16 ENCOUNTER — Telehealth: Payer: Self-pay | Admitting: *Deleted

## 2015-09-16 NOTE — Telephone Encounter (Signed)
Spoke with patient and scheduled first banding on 10/26/15 at 2:15 PM. Mailed information on procedure to patient.

## 2015-09-16 NOTE — Telephone Encounter (Signed)
Left a message for patient to call back.(discuss hemorrhoids and if interested in banding)

## 2015-09-16 NOTE — Telephone Encounter (Signed)
Sounds good we will see him for banding procedure. Thanks for scheduling.

## 2015-10-01 ENCOUNTER — Ambulatory Visit: Payer: Medicare Other | Admitting: Gastroenterology

## 2015-10-26 ENCOUNTER — Encounter: Payer: Medicare Other | Admitting: Gastroenterology

## 2015-11-23 ENCOUNTER — Ambulatory Visit (INDEPENDENT_AMBULATORY_CARE_PROVIDER_SITE_OTHER): Payer: Medicare Other | Admitting: Gastroenterology

## 2015-11-23 ENCOUNTER — Encounter: Payer: Self-pay | Admitting: Gastroenterology

## 2015-11-23 VITALS — BP 124/90 | HR 64 | Ht 74.0 in | Wt 228.5 lb

## 2015-11-23 DIAGNOSIS — K641 Second degree hemorrhoids: Secondary | ICD-10-CM | POA: Diagnosis not present

## 2015-11-23 MED ORDER — AMBULATORY NON FORMULARY MEDICATION: 0.1250 g | Status: DC | PRN

## 2015-11-23 NOTE — Patient Instructions (Signed)

## 2015-11-23 NOTE — Progress Notes (Signed)
PROCEDURE NOTE: The patient presents with symptomatic grade II  hemorrhoids, requesting rubber band ligation of his/her hemorrhoidal disease.  All risks, benefits and alternative forms of therapy were described and informed consent was obtained.  In the Left Lateral Decubitus position anoscopic examination revealed grade II hemorrhoids in the all position(s).  The anorectum was pre-medicated with 0.125% nitroglycerine The decision was made to band the RA internal hemorrhoid, and the [place] was used to perform band ligation without complication.  Digital anorectal examination was then performed to assure proper positioning of the band, and to adjust the banded tissue as required.  The patient was discharged home without pain or other issues.  Dietary and behavioral recommendations were given and along with follow-up instructions.     The following adjunctive treatments were recommended: Daily fiber supplement Trial of empiric nitroglycerin ointment 0.125% if any anal discomofrt  The patient will return in 2 weeks for  follow-up and possible additional banding as required. No complications were encountered and the patient tolerated the procedure well.

## 2015-12-07 ENCOUNTER — Encounter: Payer: Medicare Other | Admitting: Gastroenterology

## 2015-12-15 ENCOUNTER — Telehealth: Payer: Self-pay | Admitting: Gastroenterology

## 2015-12-15 NOTE — Telephone Encounter (Signed)
Patient wanted to know if nitroglyerin ointment is to be used daily or as needed. Explained his rx states prn. He is still having some hemorrhoid pain. He missed his last banding appointment(only had 1 so far) and has rescheduled.

## 2015-12-21 ENCOUNTER — Telehealth: Payer: Self-pay | Admitting: Gastroenterology

## 2016-01-03 ENCOUNTER — Encounter: Payer: Self-pay | Admitting: Gastroenterology

## 2016-01-03 ENCOUNTER — Ambulatory Visit (INDEPENDENT_AMBULATORY_CARE_PROVIDER_SITE_OTHER): Payer: Medicare Other | Admitting: Gastroenterology

## 2016-01-03 VITALS — BP 116/88 | HR 72 | Ht 76.0 in | Wt 229.6 lb

## 2016-01-03 DIAGNOSIS — K649 Unspecified hemorrhoids: Secondary | ICD-10-CM

## 2016-01-03 NOTE — Progress Notes (Signed)
PROCEDURE NOTE: The patient presents with symptomatic grade II  hemorrhoids, requesting rubber band ligation of his/her hemorrhoidal disease.  All risks, benefits and alternative forms of therapy were described and informed consent was obtained.   The anorectum was pre-medicated with 0.125% nitroglycerin The decision was made to band the LL internal hemorrhoid, and the [place] was used to perform band ligation without complication.  Digital anorectal examination was then performed to assure proper positioning of the band, and to adjust the banded tissue as required.  The patient was discharged home without pain or other issues.  Dietary and behavioral recommendations were given and along with follow-up instructions.     The following adjunctive treatments were recommended: Daily fiber supplement  The patient will return in 2-3 weeks for  follow-up and possible additional banding as required. No complications were encountered and the patient tolerated the procedure well.  [place] Cellar, MD [facility] Pager [phone]

## 2016-01-03 NOTE — Patient Instructions (Signed)
HEMORRHOID BANDING PROCEDURE    FOLLOW-UP CARE   1. The procedure you have had should have been relatively painless since the banding of the area involved does not have nerve endings and there is no pain sensation.  The rubber band cuts off the blood supply to the hemorrhoid and the band may fall off as soon as 48 hours after the banding (the band may occasionally be seen in the toilet bowl following a bowel movement). You may notice a temporary feeling of fullness in the rectum which should respond adequately to plain Tylenol or Motrin.  2. Following the banding, avoid strenuous exercise that evening and resume full activity the next day.  A sitz bath (soaking in a warm tub) or bidet is soothing, and can be useful for cleansing the area after bowel movements.     3. To avoid constipation, take two tablespoons of natural wheat bran, natural oat bran, flax, Benefiber or any over the counter fiber supplement and increase your water intake to 7-8 glasses daily.    4. Unless you have been prescribed anorectal medication, do not put anything inside your rectum for two weeks: No suppositories, enemas, fingers, etc.  5. Occasionally, you may have more bleeding than usual after the banding procedure.  This is often from the untreated hemorrhoids rather than the treated one.  Don't be concerned if there is a tablespoon or so of blood.  If there is more blood than this, lie flat with your bottom higher than your head and apply an ice pack to the area. If the bleeding does not stop within a half an hour or if you feel faint, call our office at [phone] or go to the emergency room.  6. Problems are not common; however, if there is a substantial amount of bleeding, severe pain, chills, fever or difficulty passing urine (very rare) or other problems, you should call us at [phone] or report to the nearest emergency room.  7. Do not stay seated continuously for more than 2-3 hours for a day or two  after the procedure.  Tighten your buttock muscles 10-15 times every two hours and take 10-15 deep breaths every 1-2 hours.  Do not spend more than a few minutes on the toilet if you cannot empty your bowel; instead re-visit the toilet at a later time.   Your 3rd banding is scheduled on 02/09/2016 at 4pm

## 2016-01-10 ENCOUNTER — Telehealth: Payer: Self-pay | Admitting: Family Medicine

## 2016-01-10 NOTE — Telephone Encounter (Signed)
Refill request for Clindamycin 1% apply topically bid and send to [facility].

## 2016-01-11 MED ORDER — CLINDAMYCIN PHOSPHATE 1 % EX GEL: Freq: Two times a day (BID) | CUTANEOUS | Status: DC

## 2016-01-11 NOTE — Telephone Encounter (Signed)
I sent script e-scribe. 

## 2016-01-13 NOTE — Telephone Encounter (Signed)
Spoke to patient. Will contact Billing to have charges removed.

## 2016-01-26 ENCOUNTER — Telehealth: Payer: Self-pay | Admitting: Family Medicine

## 2016-01-26 NOTE — Telephone Encounter (Signed)
[employer] called to advise pt's  ALPRAZolam (XANAX) 0.25 MG tablet  Has been approved.  Letter will be sent out.

## 2016-02-09 ENCOUNTER — Encounter: Payer: Self-pay | Admitting: Gastroenterology

## 2016-02-09 ENCOUNTER — Ambulatory Visit (INDEPENDENT_AMBULATORY_CARE_PROVIDER_SITE_OTHER): Payer: Medicare Other | Admitting: Gastroenterology

## 2016-02-09 ENCOUNTER — Encounter: Payer: Medicare Other | Admitting: Gastroenterology

## 2016-02-09 VITALS — BP 122/80 | HR 82 | Ht 76.0 in | Wt 228.0 lb

## 2016-02-09 DIAGNOSIS — K642 Third degree hemorrhoids: Secondary | ICD-10-CM

## 2016-02-09 NOTE — Patient Instructions (Signed)

## 2016-02-09 NOTE — Progress Notes (Signed)
PROCEDURE NOTE: The patient presents with symptomatic grade III  hemorrhoids, requesting rubber band ligation of his/her hemorrhoidal disease.  All risks, benefits and alternative forms of therapy were described and informed consent was obtained.   The anorectum was pre-medicated with 0.125% nitroglycerin The decision was made to band the RP internal hemorrhoid, and the [place] was used to perform band ligation without complication.  Digital anorectal examination was then performed to assure proper positioning of the band, and to adjust the banded tissue as required.  The patient was discharged home without pain or other issues.  Dietary and behavioral recommendations were given and along with follow-up instructions.     The following adjunctive treatments were recommended: Daily fiber supplement  The patient will return for  follow-up and possible additional banding as required if needed. He has had 3 bands placed but hemorrhoids are large and may require an additional band.  No complications were encountered and the patient tolerated the procedure well.  [place] Cellar, MD [facility] Pager [phone]

## 2016-02-21 DIAGNOSIS — H5213 Myopia, bilateral: Secondary | ICD-10-CM | POA: Diagnosis not present

## 2016-02-21 DIAGNOSIS — H04123 Dry eye syndrome of bilateral lacrimal glands: Secondary | ICD-10-CM | POA: Diagnosis not present

## 2016-03-16 ENCOUNTER — Encounter: Payer: Medicare Other | Admitting: Gastroenterology

## 2016-03-24 DIAGNOSIS — M76822 Posterior tibial tendinitis, left leg: Secondary | ICD-10-CM | POA: Diagnosis not present

## 2016-03-29 ENCOUNTER — Other Ambulatory Visit: Payer: Self-pay

## 2016-03-29 NOTE — Telephone Encounter (Signed)
Call in #90 with 5 rf 

## 2016-03-30 MED ORDER — ALPRAZOLAM 0.25 MG PO TABS: ORAL_TABLET | ORAL | Status: DC

## 2016-03-30 NOTE — Telephone Encounter (Signed)
Rx called in to pharmacy. 

## 2016-04-06 DIAGNOSIS — M76822 Posterior tibial tendinitis, left leg: Secondary | ICD-10-CM | POA: Diagnosis not present

## 2016-04-13 DIAGNOSIS — M76822 Posterior tibial tendinitis, left leg: Secondary | ICD-10-CM | POA: Diagnosis not present

## 2016-04-18 DIAGNOSIS — M76822 Posterior tibial tendinitis, left leg: Secondary | ICD-10-CM | POA: Diagnosis not present

## 2016-04-21 DIAGNOSIS — M76822 Posterior tibial tendinitis, left leg: Secondary | ICD-10-CM | POA: Diagnosis not present

## 2016-06-06 DIAGNOSIS — M76822 Posterior tibial tendinitis, left leg: Secondary | ICD-10-CM | POA: Diagnosis not present

## 2016-06-09 DIAGNOSIS — M76822 Posterior tibial tendinitis, left leg: Secondary | ICD-10-CM | POA: Diagnosis not present

## 2016-06-13 DIAGNOSIS — M76822 Posterior tibial tendinitis, left leg: Secondary | ICD-10-CM | POA: Diagnosis not present

## 2016-06-16 DIAGNOSIS — M76822 Posterior tibial tendinitis, left leg: Secondary | ICD-10-CM | POA: Diagnosis not present

## 2016-06-20 DIAGNOSIS — M76822 Posterior tibial tendinitis, left leg: Secondary | ICD-10-CM | POA: Diagnosis not present

## 2016-06-23 DIAGNOSIS — M76822 Posterior tibial tendinitis, left leg: Secondary | ICD-10-CM | POA: Diagnosis not present

## 2016-07-12 DIAGNOSIS — D485 Neoplasm of uncertain behavior of skin: Secondary | ICD-10-CM | POA: Diagnosis not present

## 2016-07-12 DIAGNOSIS — L579 Skin changes due to chronic exposure to nonionizing radiation, unspecified: Secondary | ICD-10-CM | POA: Diagnosis not present

## 2016-07-12 DIAGNOSIS — B079 Viral wart, unspecified: Secondary | ICD-10-CM | POA: Diagnosis not present

## 2016-07-18 ENCOUNTER — Ambulatory Visit (INDEPENDENT_AMBULATORY_CARE_PROVIDER_SITE_OTHER): Payer: Medicare Other | Admitting: *Deleted

## 2016-07-18 DIAGNOSIS — Z23 Encounter for immunization: Secondary | ICD-10-CM | POA: Diagnosis not present

## 2016-10-11 ENCOUNTER — Telehealth: Payer: Self-pay | Admitting: Family Medicine

## 2016-10-11 NOTE — Telephone Encounter (Signed)
Error/ltd ° °

## 2016-10-12 ENCOUNTER — Other Ambulatory Visit: Payer: Self-pay | Admitting: Family Medicine

## 2016-10-12 ENCOUNTER — Telehealth: Payer: Self-pay | Admitting: Family Medicine

## 2016-10-12 NOTE — Telephone Encounter (Signed)
Pt need new Rx for alprazolam  [place]

## 2016-10-13 MED ORDER — ALPRAZOLAM 0.25 MG PO TABS: ORAL_TABLET | ORAL | 5 refills | Status: DC

## 2016-10-13 NOTE — Telephone Encounter (Signed)
Call in #90 with 5 rf 

## 2016-10-13 NOTE — Telephone Encounter (Signed)
I called in script 

## 2016-10-17 ENCOUNTER — Encounter: Payer: Self-pay | Admitting: Family Medicine

## 2016-10-17 ENCOUNTER — Other Ambulatory Visit: Payer: Self-pay | Admitting: Family Medicine

## 2016-10-17 ENCOUNTER — Ambulatory Visit (INDEPENDENT_AMBULATORY_CARE_PROVIDER_SITE_OTHER): Payer: Medicare Other | Admitting: Family Medicine

## 2016-10-17 VITALS — BP 120/88 | HR 74 | Temp 98.3°F | Wt 219.8 lb

## 2016-10-17 DIAGNOSIS — R531 Weakness: Secondary | ICD-10-CM | POA: Diagnosis not present

## 2016-10-17 LAB — POC URINALSYSI DIPSTICK (AUTOMATED)
BILIRUBIN UA: NEGATIVE
Glucose, UA: NEGATIVE
KETONES UA: NEGATIVE
Leukocytes, UA: NEGATIVE
Nitrite, UA: NEGATIVE
PH UA: 7
PROTEIN UA: NEGATIVE
RBC UA: NEGATIVE
SPEC GRAV UA: 1.015
Urobilinogen, UA: 0.2

## 2016-10-17 LAB — HEPATIC FUNCTION PANEL
ALBUMIN: 4.7 g/dL (ref 3.5–5.2)
ALT: 34 U/L (ref 0–53)
AST: 20 U/L (ref 0–37)
Alkaline Phosphatase: 81 U/L (ref 39–117)
BILIRUBIN TOTAL: 0.7 mg/dL (ref 0.2–1.2)
Bilirubin, Direct: 0.1 mg/dL (ref 0.0–0.3)
Total Protein: 7.1 g/dL (ref 6.0–8.3)

## 2016-10-17 LAB — CBC WITH DIFFERENTIAL/PLATELET
BASOS ABS: 0.1 10*3/uL (ref 0.0–0.1)
Basophils Relative: 1 % (ref 0.0–3.0)
EOS ABS: 0.2 10*3/uL (ref 0.0–0.7)
Eosinophils Relative: 2.8 % (ref 0.0–5.0)
HCT: 44.4 % (ref 39.0–52.0)
Hemoglobin: 15.5 g/dL (ref 13.0–17.0)
LYMPHS ABS: 1.5 10*3/uL (ref 0.7–4.0)
Lymphocytes Relative: 23 % (ref 12.0–46.0)
MCHC: 35 g/dL (ref 30.0–36.0)
MCV: 86.2 fl (ref 78.0–100.0)
Monocytes Absolute: 0.4 10*3/uL (ref 0.1–1.0)
Monocytes Relative: 6.3 % (ref 3.0–12.0)
NEUTROS ABS: 4.4 10*3/uL (ref 1.4–7.7)
NEUTROS PCT: 66.9 % (ref 43.0–77.0)
PLATELETS: 226 10*3/uL (ref 150.0–400.0)
RBC: 5.15 Mil/uL (ref 4.22–5.81)
RDW: 13 % (ref 11.5–15.5)
WBC: 6.6 10*3/uL (ref 4.0–10.5)

## 2016-10-17 LAB — T4, FREE: FREE T4: 0.87 ng/dL (ref 0.60–1.60)

## 2016-10-17 LAB — BASIC METABOLIC PANEL
BUN: 14 mg/dL (ref 6–23)
CHLORIDE: 103 meq/L (ref 96–112)
CO2: 30 mEq/L (ref 19–32)
CREATININE: 1.15 mg/dL (ref 0.40–1.50)
Calcium: 10.2 mg/dL (ref 8.4–10.5)
GFR: 70.37 mL/min (ref >60.00)
Glucose, Bld: 69 mg/dL — ABNORMAL LOW (ref 70–99)
POTASSIUM: 4.2 meq/L (ref 3.5–5.1)
Sodium: 141 mEq/L (ref 135–145)

## 2016-10-17 LAB — TSH: TSH: 1.89 u[IU]/mL (ref 0.35–4.50)

## 2016-10-17 LAB — T3, FREE: T3 FREE: 3.2 pg/mL (ref 2.3–4.2)

## 2016-10-17 LAB — VITAMIN B12: VITAMIN B 12: 813 pg/mL (ref 211–911)

## 2016-10-17 LAB — TESTOSTERONE: Testosterone: 263.8 ng/dL — ABNORMAL LOW (ref 300.00–890.00)

## 2016-10-17 NOTE — Progress Notes (Signed)
Pre visit review using our clinic review tool, if applicable. No additional management support is needed unless otherwise documented below in the visit note. 

## 2016-10-17 NOTE — Progress Notes (Signed)
   Subjective:    Patient ID: Bernard James, male    DOB: 11-Oct-1960, 56 y.o.   MRN: [id number]  HPI Here to discuss chronic fatigue and mild generalized weakness.  His sleep and appetite are normal. His moods are stable. No other specific symptoms. He asks to have some screening labs.    Review of Systems  Constitutional: Positive for fatigue. Negative for activity change, appetite change and unexpected weight change.  Respiratory: Negative.   Cardiovascular: Negative.   Gastrointestinal: Negative.   Genitourinary: Negative.   Neurological: Negative.   Psychiatric/Behavioral: Negative.        Objective:   Physical Exam  Constitutional: He is oriented to person, place, and time. He appears well-developed and well-nourished.  Neck: No thyromegaly present.  Cardiovascular: Normal rate, regular rhythm, normal heart sounds and intact distal pulses.   Pulmonary/Chest: Effort normal and breath sounds normal.  Lymphadenopathy:    He has no cervical adenopathy.  Neurological: He is alert and oriented to person, place, and time.          Assessment & Plan:  Fatigue. Screen with labs. I recommended he exercise regularly.  Alysia Penna, MD

## 2016-10-18 LAB — LYME AB/WESTERN BLOT REFLEX: B burgdorferi Ab IgG+IgM: <0.9 {index}

## 2016-10-23 ENCOUNTER — Telehealth: Payer: Self-pay | Admitting: Family Medicine

## 2016-10-23 MED ORDER — TESTOSTERONE 20.25 MG/ACT (1.62%) TD GEL: 4.0000 | Freq: Every day | TRANSDERMAL | 5 refills | Status: DC

## 2016-10-23 NOTE — Telephone Encounter (Signed)
Script was faxed to [facility].

## 2016-10-23 NOTE — Telephone Encounter (Signed)
I printed the rx  

## 2016-10-23 NOTE — Telephone Encounter (Signed)
I spoke with pt and Androgel is covered, it will still need a prior authorization once script has been sent in.

## 2016-11-10 ENCOUNTER — Telehealth: Payer: Self-pay

## 2016-11-10 NOTE — Telephone Encounter (Signed)
Received PA request from [facility] for Androgel. PA submitted & is pending. Key: [id number]

## 2016-11-10 NOTE — Telephone Encounter (Signed)
PA approved, form faxed back to pharmacy. 

## 2016-11-20 ENCOUNTER — Telehealth: Payer: Self-pay | Admitting: Family Medicine

## 2016-11-20 NOTE — Telephone Encounter (Signed)
After reading the side effects of the med Testosterone (ANDROGEL PUMP) 20.25 MG/ACT (1.62%) GEL  Pt would like to discuss with you what you think the risk might be for him.  Pt concerned about heart attack and other things he has read on the internet. Pt states he can take back the Rx today if you all decide its not for him.

## 2016-11-20 NOTE — Telephone Encounter (Signed)
Per Dr. Sarajane Jews, okay to take, it is ultimately up to the patient. I spoke with pt and gave this information.

## 2016-12-04 DIAGNOSIS — H5213 Myopia, bilateral: Secondary | ICD-10-CM | POA: Diagnosis not present

## 2016-12-04 DIAGNOSIS — H04123 Dry eye syndrome of bilateral lacrimal glands: Secondary | ICD-10-CM | POA: Diagnosis not present

## 2017-02-06 DIAGNOSIS — L820 Inflamed seborrheic keratosis: Secondary | ICD-10-CM | POA: Diagnosis not present

## 2017-02-06 DIAGNOSIS — D1801 Hemangioma of skin and subcutaneous tissue: Secondary | ICD-10-CM | POA: Diagnosis not present

## 2017-02-14 DIAGNOSIS — H5711 Ocular pain, right eye: Secondary | ICD-10-CM | POA: Diagnosis not present

## 2017-05-16 ENCOUNTER — Other Ambulatory Visit: Payer: Self-pay | Admitting: Family Medicine

## 2017-05-17 NOTE — Telephone Encounter (Signed)
Call in #90 with 5 rf 

## 2017-06-04 ENCOUNTER — Telehealth: Payer: Self-pay | Admitting: Family Medicine

## 2017-06-04 NOTE — Telephone Encounter (Signed)
We will need to see him before offering any treatment

## 2017-06-04 NOTE — Telephone Encounter (Signed)
Dr. Sarajane Jews - Patient is requesting "something be called in" for him instead of him coming in to office. He has already been scheduled to see you tomorrow at 1pm. Please advise. Thanks!

## 2017-06-04 NOTE — Telephone Encounter (Signed)
[facility] - Client [place] [facility] Patient Name: Bernard James DOB: 05/08/1961 Initial Comment Caller states he has been having stomach issues for about a week. Symptoms: cramping, 10 days feeling something in intestines, Saturday morning BM, felt better a little. Yesterday and today, nervous stomach, took zanex to calm him down. Had a colonoscopy, everything ok, ate grilled shrimp, and wonder if that may be it. He is asking for something to be called in for him. Nurse Assessment Nurse: Markus Daft, RN, Sherre Poot Date/Time (Eastern Time): 06/04/2017 1:47:12 PM Confirm and document reason for call. If symptomatic, describe symptoms. ---Caller states he has been having cramping, c/o 10 days feeling something in intestines then had explosive BM on Saturday morning - not diarrhea, and has had a BM daily. He felt better a little. Yesterday and today, nervous stomach, took Xanex to calm him down. As soon as he eats, he feels blah, not nauseated. No vomiting. He ate grilled shrimp 2 wks ago, and wonder if that may be it. He is asking if something can be called in for him? Does the patient have any new or worsening symptoms? ---Yes Will a triage be completed? ---Yes Related visit to physician within the last 2 weeks? ---No Does the PT have any chronic conditions? (i.e. diabetes, asthma, etc.) ---Yes List chronic conditions. 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 ---No Guidelines Guideline Title Affirmed Question Affirmed Notes Abdominal Pain - Male [1] MODERATE pain (e.g., interferes with normal activities) AND [2] pain comes and goes (cramps) AND [3] present > 24 hours (Exception: pain with Vomiting or Diarrhea - see that Guideline) Final Disposition User See Physician within [place], [place], [place] Comments No diarrhea/vomiting. He is having left mid  - lower abdominal pain off/on, last felt it 2 hrs ago. Laying down now and doesn't feel it. Appt made with Dr. Sarajane Jews at 1 pm tomorrow. (he declined 2:45 pm appt for today and states that he wants MD to just call something in if possible - uses [facility] in [place]. Allergy: Doxycycline)  Referrals REFERRED TO PCP OFFICE Disagree/Comply: Comply

## 2017-06-05 ENCOUNTER — Encounter: Payer: Self-pay | Admitting: Family Medicine

## 2017-06-05 ENCOUNTER — Ambulatory Visit (INDEPENDENT_AMBULATORY_CARE_PROVIDER_SITE_OTHER): Payer: Medicare Other | Admitting: Family Medicine

## 2017-06-05 VITALS — BP 140/90 | HR 77 | Ht 76.0 in | Wt 221.0 lb

## 2017-06-05 DIAGNOSIS — K5732 Diverticulitis of large intestine without perforation or abscess without bleeding: Secondary | ICD-10-CM

## 2017-06-05 MED ORDER — AMOXICILLIN 875 MG PO TABS: 875.0000 mg | ORAL_TABLET | Freq: Two times a day (BID) | ORAL | 0 refills | Status: DC

## 2017-06-05 NOTE — Progress Notes (Signed)
   Subjective:    Patient ID: Bernard James, male    DOB: 28-Feb-1961, 56 y.o.   MRN: [id number]  HPI Here for one week of intermittent LLQ abdominal cramps and diarrhea. No fever or nausea. Mylanta helps. He did have diverticulosis on his colonoscopy a few years ago.    Review of Systems  Constitutional: Negative.   Respiratory: Negative.   Cardiovascular: Negative.   Gastrointestinal: Positive for abdominal pain and diarrhea. Negative for abdominal distention, anal bleeding, blood in stool, constipation, nausea, rectal pain and vomiting.  Genitourinary: Negative.        Objective:   Physical Exam  Constitutional: He appears well-developed and well-nourished.  Cardiovascular: Normal rate, regular rhythm, normal heart sounds and intact distal pulses.   Pulmonary/Chest: Effort normal and breath sounds normal. No respiratory distress. He has no wheezes. He has no rales.  Abdominal: Soft. Bowel sounds are normal. He exhibits no distension and no mass. There is no rebound and no guarding.  Mildly tender in the LLQ           Assessment & Plan:  Diverticulitis, treat with Amoxicillin.  Alysia Penna, MD

## 2017-06-05 NOTE — Telephone Encounter (Signed)
Called and left message advising pt to keep appt scheduled for this afternoon.

## 2017-06-05 NOTE — Patient Instructions (Signed)
WE NOW OFFER   [facility] FAST TRACK!!!  SAME DAY Appointments for ACUTE CARE  Such as: Sprains, Injuries, cuts, abrasions, rashes, muscle pain, joint pain, back pain Colds, flu, sore throats, headache, allergies, cough, fever  Ear pain, sinus and eye infections Abdominal pain, nausea, vomiting, diarrhea, upset stomach Animal/insect bites  3 Easy Ways to Schedule: Walk-In Scheduling Call in scheduling Mychart Sign-up: [email or web address]         

## 2017-06-15 ENCOUNTER — Telehealth: Payer: Self-pay

## 2017-06-15 NOTE — Telephone Encounter (Signed)
Patient called to ask about adding OTC medications such as Pepcid and Mylanta. He denies any current abdominal pain, blood in stool or fever. Advised pt that he can add an antacid medication and use Mylanta as directed but not long term. He has been taking abx as directed and thinks this may be contributing to extra stomach acid. He has been taking a probiotic. Advised pt to continue this and contact office if any increase in symptoms or these OTC medications do not help. He voiced understanding to all. Nothing further needed at this time.

## 2017-06-18 ENCOUNTER — Other Ambulatory Visit: Payer: Self-pay | Admitting: Family Medicine

## 2017-06-19 NOTE — Telephone Encounter (Signed)
Can we refill thsi?

## 2017-06-28 ENCOUNTER — Telehealth: Payer: Self-pay | Admitting: Family Medicine

## 2017-06-28 ENCOUNTER — Encounter: Payer: Self-pay | Admitting: Family Medicine

## 2017-06-28 NOTE — Telephone Encounter (Signed)
Pt would like to know if ok to start adding a fiber supplement to his diet, like psyllium husks.  Pt had diverticulitis a few weeks ago, and wants to prevent this from happening agian   Pt would like a call back

## 2017-06-29 NOTE — Telephone Encounter (Signed)
Yes taking psyllium is a good idea

## 2017-06-29 NOTE — Telephone Encounter (Signed)
I left a voice message with below information. 

## 2017-08-07 ENCOUNTER — Ambulatory Visit (INDEPENDENT_AMBULATORY_CARE_PROVIDER_SITE_OTHER): Payer: Medicare Other | Admitting: *Deleted

## 2017-08-07 DIAGNOSIS — Z23 Encounter for immunization: Secondary | ICD-10-CM | POA: Diagnosis not present

## 2017-08-24 ENCOUNTER — Other Ambulatory Visit: Payer: Self-pay

## 2017-08-24 ENCOUNTER — Emergency Department (HOSPITAL_COMMUNITY)
Admission: EM | Admit: 2017-08-24 | Discharge: 2017-08-24 | Disposition: A | Discharge status: Home / Self Care | Payer: Medicare Other | Attending: Emergency Medicine | Admitting: Emergency Medicine

## 2017-08-24 ENCOUNTER — Encounter (HOSPITAL_COMMUNITY): Payer: Self-pay | Admitting: Emergency Medicine

## 2017-08-24 DIAGNOSIS — Z79899 Other long term (current) drug therapy: Secondary | ICD-10-CM | POA: Insufficient documentation

## 2017-08-24 DIAGNOSIS — R42 Dizziness and giddiness: Secondary | ICD-10-CM | POA: Diagnosis not present

## 2017-08-24 LAB — BASIC METABOLIC PANEL
ANION GAP: 9 (ref 5–15)
BUN: 12 mg/dL (ref 6–20)
CALCIUM: 9.2 mg/dL (ref 8.9–10.3)
CO2: 22 mmol/L (ref 22–32)
Chloride: 104 mmol/L (ref 101–111)
Creatinine, Ser: 1.09 mg/dL (ref 0.61–1.24)
GFR calc non Af Amer: >60 mL/min (ref >60)
GLUCOSE: 143 mg/dL — AB (ref 65–99)
POTASSIUM: 3.5 mmol/L (ref 3.5–5.1)
Sodium: 135 mmol/L (ref 135–145)

## 2017-08-24 LAB — CBC WITH DIFFERENTIAL/PLATELET
BASOS ABS: 0 10*3/uL (ref 0.0–0.1)
Basophils Relative: 1 %
EOS ABS: 0.2 10*3/uL (ref 0.0–0.7)
EOS PCT: 3 %
HCT: 42.2 % (ref 39.0–52.0)
HEMOGLOBIN: 15 g/dL (ref 13.0–17.0)
LYMPHS PCT: 19 %
Lymphs Abs: 1 10*3/uL (ref 0.7–4.0)
MCH: 30.9 pg (ref 26.0–34.0)
MCHC: 35.5 g/dL (ref 30.0–36.0)
MCV: 87 fL (ref 78.0–100.0)
Monocytes Absolute: 0.2 10*3/uL (ref 0.1–1.0)
Monocytes Relative: 4 %
NEUTROS PCT: 73 %
Neutro Abs: 3.7 10*3/uL (ref 1.7–7.7)
PLATELETS: 164 10*3/uL (ref 150–400)
RBC: 4.85 MIL/uL (ref 4.22–5.81)
RDW: 12.6 % (ref 11.5–15.5)
WBC: 5 10*3/uL (ref 4.0–10.5)

## 2017-08-24 LAB — URINALYSIS, ROUTINE W REFLEX MICROSCOPIC
Bilirubin Urine: NEGATIVE
Glucose, UA: NEGATIVE mg/dL
Hgb urine dipstick: NEGATIVE
KETONES UR: NEGATIVE mg/dL
LEUKOCYTES UA: NEGATIVE
NITRITE: NEGATIVE
PH: 7 (ref 5.0–8.0)
PROTEIN: NEGATIVE mg/dL
Specific Gravity, Urine: 1.002 — ABNORMAL LOW (ref 1.005–1.030)

## 2017-08-24 LAB — TROPONIN I

## 2017-08-24 LAB — CBG MONITORING, ED: Glucose-Capillary: 152 mg/dL — ABNORMAL HIGH (ref 65–99)

## 2017-08-24 MED ORDER — SODIUM CHLORIDE 0.9 % IV BOLUS (SEPSIS)
500.0000 mL | Freq: Once | INTRAVENOUS | Status: AC
  Administered 2017-08-24: 500 mL via INTRAVENOUS

## 2017-08-24 NOTE — ED Triage Notes (Signed)
Pt presents with complaint of dizziness and jittery "like my blood pressure was going up" while waiting for mother at [facility]. Pt alert and oriented x4. Pt took 2 glucose tablets post event.

## 2017-08-24 NOTE — ED Provider Notes (Signed)
Emergency Department Provider Note   I have reviewed the triage vital signs and the nursing notes.   HISTORY  Chief Complaint Dizziness   HPI Bernard James is a 56 y.o. male with PMH of anxiety, GERD, HLD, and prior CVA s/p dissection presents to the emergency department for evaluation of feeling "jittery" and lightheaded.  The patient was accompanying his mother to her oncology infusions today when the symptoms began.  Patient states like he felt like his blood pressure was increasing he began to feel anxious.  No similar symptoms in the past.  Denies chest pain, palpitations, dyspnea.  He is on a medication for hypertension.  Denies alcohol or drug use.  No recent fevers or chills.  He was treated approximately 1 month ago for diverticulitis but is no longer taking antibiotics.    Past Medical History:  Diagnosis Date  . Allergy    allergic rhinitis  . Anxiety   . Depression   . GERD (gastroesophageal reflux disease)   . Hyperlipidemia   . Stroke [facility])    in cerebellum  . Tension headache     Patient Active Problem List   Diagnosis Date Noted  . Acne rosacea 06/25/2014  . WEAKNESS 07/08/2010  . LACERATION, FINGER 08/13/2009  . DIZZINESS 03/04/2008  . GERD 12/20/2007  . ANXIETY 09/10/2007  . OTHER ACUTE SINUSITIS 09/10/2007  . HYPERLIPIDEMIA 07/23/2007  . DEPRESSION 07/23/2007  . ALLERGIC RHINITIS 07/23/2007  . CEREBROVASCULAR ACCIDENT, HX OF 07/23/2007    Past Surgical History:  Procedure Laterality Date  . HERNIA REPAIR     bilateral, inguinal  . LIGAMENT REPAIR     repair of torn ligament in left ankle  . TONSILLECTOMY      Current Outpatient Rx  . Order #: [id number] Class: Historical Med  . Order #: [id number] Class: Phone In  . Order #: [id number] Class: Normal  . Order #: [id number] Class: Historical Med  . Order #: [id number] Class: Normal  . Order #: [id number] Class: Normal  . Order #: [id number] Class: Normal  . Order #: [id number] Class: Normal  . Order  #: [id number] Class: Historical Med  . Order #: [id number] Class: Print  . Order #: [id number] Class: Normal    Allergies Doxycycline hyclate and Flagyl [metronidazole]  Family History  Problem Relation Age of Onset  . Atrial fibrillation Father   . Hypertension Other   . Arthritis Other   . Fibromyalgia Other   . Cancer Other        lymphoma  . Colon cancer Neg Hx     Social History Social History   Tobacco Use  . Smoking status: Never Smoker  . Smokeless tobacco: Never Used  Substance Use Topics  . Alcohol use: No    Alcohol/week: 0.0 oz  . Drug use: No    Review of Systems  Constitutional: No fever/chills. Positive lightheadedness.  Eyes: No visual changes. ENT: No sore throat. Cardiovascular: Denies chest pain. Respiratory: Denies shortness of breath. Gastrointestinal: No abdominal pain.  No nausea, no vomiting.  No diarrhea.  No constipation. Genitourinary: Negative for dysuria. Musculoskeletal: Negative for back pain. Skin: Negative for rash. Neurological: Negative for headaches, focal weakness or numbness.  10-point ROS otherwise negative.  ____________________________________________   PHYSICAL EXAM:  VITAL SIGNS: ED Triage Vitals [08/24/17 1344]  Enc Vitals Group     BP (!) 154/94     Pulse Rate 83     Resp 16     Temp 97.9 F (36.6 C)     Temp Source Oral  SpO2 98 %     Weight 220 lb (99.8 kg)     Height 6\' 3"  (1.905 m)    Constitutional: Alert and oriented. Well appearing and in no acute distress. Eyes: Conjunctivae are normal. PERRL. EOMI.  Head: Atraumatic. Nose: No congestion/rhinnorhea. Mouth/Throat: Mucous membranes are moist.  Neck: No stridor. Cardiovascular: Normal rate, regular rhythm. Good peripheral circulation. Grossly normal heart sounds.   Respiratory: Normal respiratory effort.  No retractions. Lungs CTAB. Gastrointestinal: Soft and nontender. No distention.  Musculoskeletal: No lower extremity tenderness nor edema. No  gross deformities of extremities. Neurologic:  Normal speech and language. No gross focal neurologic deficits are appreciated. Normal CN exam 2-12. No pronator drift.  Skin:  Skin is warm, dry and intact. No rash noted.  ____________________________________________   LABS (all labs ordered are listed, but only abnormal results are displayed)  Labs Reviewed  BASIC METABOLIC PANEL - Abnormal; Notable for the following components:      Result Value   Glucose, Bld 143 (*)    All other components within normal limits  URINALYSIS, ROUTINE W REFLEX MICROSCOPIC - Abnormal; Notable for the following components:   Color, Urine STRAW (*)    Specific Gravity, Urine 1.002 (*)    All other components within normal limits  CBG MONITORING, ED - Abnormal; Notable for the following components:   Glucose-Capillary 152 (*)    All other components within normal limits  TROPONIN I  CBC WITH DIFFERENTIAL/PLATELET   ____________________________________________  EKG   EKG Interpretation  Date/Time:  Friday August 24 2017 13:48:17 EST Ventricular Rate:  82 PR Interval:    QRS Duration: 96 QT Interval:  373 QTC Calculation: 436 R Axis:     Text Interpretation:  Sinus rhythm Probable left atrial enlargement No STEMI. Similar ST depressions seen on prior tracings.  Confirmed by Nanda Quinton [phone]) on 08/24/2017 1:51:35 PM       ____________________________________________  RADIOLOGY  None ____________________________________________   PROCEDURES  Procedure(s) performed:   Procedures  None ____________________________________________   INITIAL IMPRESSION / ASSESSMENT AND PLAN / ED COURSE  Pertinent labs & imaging results that were available during my care of the patient were reviewed by me and considered in my medical decision making (see chart for details).  Patient presents to the emergency department for evaluation of some lightheadedness and feeling jittery.  EKG shows ST  depressions that were present on prior tracings.  No chest pain, palpitations, or other anginal equivalents.  Plan for labs including troponin along with IV fluids and brief ED monitoring.   Labs and imaging unremarkable. Patient feeling better after ED observation period. No abnormality on tele. Plan for PCP and Cardiology follow up as an outpatient.   At this time, I do not feel there is any life-threatening condition present. I have reviewed and discussed all results (EKG, imaging, lab, urine as appropriate), exam findings with patient. I have reviewed nursing notes and appropriate previous records.  I feel the patient is safe to be discharged home without further emergent workup. Discussed usual and customary return precautions. Patient and family (if present) verbalize understanding and are comfortable with this plan.  Patient will follow-up with their primary care provider. If they do not have a primary care provider, information for follow-up has been provided to them. All questions have been answered.  ____________________________________________  FINAL CLINICAL IMPRESSION(S) / ED DIAGNOSES  Final diagnoses:  Lightheadedness     MEDICATIONS GIVEN DURING THIS VISIT:  Medications  sodium chloride 0.9 %  bolus 500 mL (0 mLs Intravenous Stopped 08/24/17 1538)     NEW OUTPATIENT MEDICATIONS STARTED DURING THIS VISIT:  None  Note:  This document was prepared using Dragon voice recognition software and may include unintentional dictation errors.  Nanda Quinton, MD Emergency Medicine    Reisa Coppola, [facility], MD 08/24/17 [phone]

## 2017-08-24 NOTE — Discharge Instructions (Signed)
You were seen in the ED today with lightheadedness. Your labs were normal. I would like for you to follow up with both your PCP and the Cardiology group listed.   Return to the ED with any new or worsening symptoms.

## 2017-08-24 NOTE — ED Notes (Signed)
Pt requesting update on labs. MD made aware

## 2017-08-28 ENCOUNTER — Encounter: Payer: Self-pay | Admitting: Family Medicine

## 2017-08-28 ENCOUNTER — Ambulatory Visit (INDEPENDENT_AMBULATORY_CARE_PROVIDER_SITE_OTHER): Payer: Medicare Other | Admitting: Family Medicine

## 2017-08-28 VITALS — BP 110/80 | HR 73 | Temp 98.1°F | Wt 225.8 lb

## 2017-08-28 DIAGNOSIS — Z8679 Personal history of other diseases of the circulatory system: Secondary | ICD-10-CM | POA: Diagnosis not present

## 2017-08-28 DIAGNOSIS — R42 Dizziness and giddiness: Secondary | ICD-10-CM | POA: Diagnosis not present

## 2017-08-28 NOTE — Progress Notes (Signed)
   Subjective:    Patient ID: Bernard James, male    DOB: [birth year], 56 y.o.   MRN: [id number]  HPI Here to follow up an ER visit on 08-24-17 when he had several prolonged spells of feeling lightheaded. No recent illness or trauma. No fever or headache. No recent medication changes. In the ER his exam and labs were all normal. He thinks he may have been experiencing low blood glucoses during these times. He has had normal gait but the left side has been a bit weak.    Review of Systems  HENT: Negative.   Eyes: Negative.   Respiratory: Negative.   Cardiovascular: Negative.   Neurological: Positive for weakness and light-headedness. Negative for dizziness, seizures, syncope and speech difficulty.       Objective:   Physical Exam  Constitutional: He is oriented to person, place, and time. He appears well-developed and well-nourished.  HENT:  Head: Normocephalic and atraumatic.  Right Ear: External ear normal.  Left Ear: External ear normal.  Nose: Nose normal.  Mouth/Throat: Oropharynx is clear and moist.  Eyes: Conjunctivae and EOM are normal. Pupils are equal, round, and reactive to light.  Neck: Neck supple. No thyromegaly present.  Cardiovascular: Normal rate, regular rhythm, normal heart sounds and intact distal pulses.  Pulmonary/Chest: Effort normal and breath sounds normal.  Musculoskeletal: Normal range of motion. He exhibits no edema.  Lymphadenopathy:    He has no cervical adenopathy.  Neurological: He is alert and oriented to person, place, and time.          Assessment & Plan:  He had a presyncopal spell of lightheadedness which does not sound like vertigo. He may be having having some hypoglycemic episodes. We will set him up for a 3 hour GTT soon. We will also look at some carotid dopplers.

## 2017-09-07 ENCOUNTER — Telehealth: Payer: Self-pay | Admitting: Family Medicine

## 2017-09-07 NOTE — Telephone Encounter (Signed)
Can you ask Dr. Sarajane Jews, he did order and could explain better than my self.

## 2017-09-07 NOTE — Telephone Encounter (Signed)
Like needs the lab orders to be scheduled?

## 2017-09-07 NOTE — Telephone Encounter (Signed)
I need to know information why can't she have her lab work done here? What all do they want? Is it something urgent is this why the suggest [facility]?

## 2017-09-07 NOTE — Telephone Encounter (Signed)
Copied from [place]. Topic: Appointment Scheduling - Scheduling Inquiry for Clinic >> Sep 07, 2017  3:56 PM Ether Griffins B wrote: Reason for CRM: pt needing that glucose scheduled.   [facility] is walk in, but should go early morning, this is what Jacquelynn Cree said is the way to do, but patient doesn't know

## 2017-09-07 NOTE — Telephone Encounter (Signed)
Sent to PCP ?

## 2017-09-10 NOTE — Telephone Encounter (Signed)
This test is only done at the [facility] site because we are not set up to do it here. He can just walk in one morning (the orders are in the computer) he should be fasting

## 2017-09-11 NOTE — Telephone Encounter (Signed)
Called pt and left a VM that they will need to go to the [facility] site no OV at needed just walk in fasting. Advised pt to call back if they had further questions.

## 2017-09-28 ENCOUNTER — Ambulatory Visit (HOSPITAL_COMMUNITY)
Admission: RE | Admit: 2017-09-28 | Discharge: 2017-09-28 | Disposition: A | Discharge status: Home / Self Care | Payer: Medicare Other | Source: Ambulatory Visit | Attending: Cardiovascular Disease | Admitting: Cardiovascular Disease

## 2017-09-28 DIAGNOSIS — R42 Dizziness and giddiness: Secondary | ICD-10-CM | POA: Diagnosis not present

## 2017-10-01 NOTE — Progress Notes (Signed)
Called and spoke with pt. Pt advised and voiced understanding.  

## 2017-10-29 ENCOUNTER — Other Ambulatory Visit (INDEPENDENT_AMBULATORY_CARE_PROVIDER_SITE_OTHER): Payer: Medicare Other

## 2017-10-29 ENCOUNTER — Other Ambulatory Visit: Payer: Self-pay

## 2017-10-29 DIAGNOSIS — R42 Dizziness and giddiness: Secondary | ICD-10-CM

## 2017-10-29 LAB — GLUCOSE TOLERANCE, 3 HOURS
GLUCOSE 1 HOUR GTT: 178 mg/dL
GLUCOSE, 2 HOUR: 125 mg/dL
GLUCOSE, FASTING: 92 mg/dL (ref 70–99)
Glucose, GTT - 3 Hour: 73 mg/dL

## 2017-11-02 ENCOUNTER — Telehealth: Payer: Self-pay | Admitting: Family Medicine

## 2017-11-02 ENCOUNTER — Encounter: Payer: Self-pay | Admitting: *Deleted

## 2017-11-02 NOTE — Telephone Encounter (Addendum)
Labs printed and mailed to patient as requested. Pt notified of results and verbalized understanding.

## 2017-11-02 NOTE — Telephone Encounter (Signed)
Copied from [place]. Topic: Quick Communication - Other Results >> Nov 02, 2017 11:37 AM Antonieta Iba C wrote: Pt called in to speak with CMA in regards to glucose results. Pt would like to have results mailed to him at the address on file.   Please assist further.

## 2017-12-05 ENCOUNTER — Telehealth: Payer: Self-pay | Admitting: Family Medicine

## 2017-12-05 NOTE — Telephone Encounter (Signed)
Copied from [place]. Topic: Quick Communication - Rx Refill/Question >> Dec 05, 2017  4:56 PM Wynetta Emery, [place] C wrote: Medication: ALPRAZolam Duanne Moron) 0.25 MG tablet   Has the patient contacted their pharmacy? Yes - pharmacy will be sending in request. Pt is not sure when.    (Agent: If no, request that the patient contact the pharmacy for the refill.)   Preferred Pharmacy (with phone number or street name): [id number] - [place], [place]   Agent: Please be advised that RX refills may take up to 3 business days. We ask that you follow-up with your pharmacy.

## 2017-12-06 NOTE — Telephone Encounter (Signed)
Call in #90 with 5 rf 

## 2017-12-06 NOTE — Telephone Encounter (Signed)
Last OV: 08/10/2017 Last filled: 05/17/2017, #90, 5 RF Sig: take 1 tablet by mouth three times a day if needed UDS: Not on file within the last year

## 2017-12-07 NOTE — Telephone Encounter (Signed)
Rx has been phoned in to [facility] in [place] #90 with 5 refills.

## 2018-01-01 ENCOUNTER — Telehealth: Payer: Self-pay | Admitting: Family Medicine

## 2018-01-01 NOTE — Telephone Encounter (Signed)
Request for Tretinoin microspheres(Retin-a-micro) 0.1% gel. Current prescription in chart expired in 2016.  LOV: 11/201/18  Dr. Marsa Aris [place]

## 2018-01-01 NOTE — Telephone Encounter (Signed)
Copied from [place] [id number]. Topic: Quick Communication - Rx Refill/Question >> Jan 01, 2018  1:29 PM Waylan Rocher, [place] L wrote: Medication: tretinoin microspheres (RETIN-A MICRO) 0.1 % gel  Has the patient contacted their pharmacy? Yes.   (Agent: If no, request that the patient contact the pharmacy for the refill.) Preferred Pharmacy (with phone number or street name): [id number] - [place], [place] [address] [place] [address] [place] [address] Phone: [phone] Fax: [phone] Agent: Please be advised that RX refills may take up to 3 business days. We ask that you follow-up with your pharmacy.

## 2018-01-02 NOTE — Telephone Encounter (Signed)
Sent to PCP for approval.  

## 2018-01-03 NOTE — Telephone Encounter (Signed)
This is applied at bedtime. Call in 20 gm with 11 rf

## 2018-01-04 MED ORDER — TRETINOIN MICROSPHERE 0.1 % EX GEL: Freq: Every day | CUTANEOUS | 11 refills | Status: DC

## 2018-01-04 NOTE — Telephone Encounter (Signed)
Rx has been sent in to pharmacy. 

## 2018-01-08 NOTE — Telephone Encounter (Signed)
Sent to PCP ?

## 2018-01-08 NOTE — Telephone Encounter (Signed)
The medication in ca gel is not covered. Pt is asking for cream to be sent in,. He believes this will be covered by insurance,  [facility] [address], [place]

## 2018-01-09 MED ORDER — TRETINOIN 0.1 % EX CREA: TOPICAL_CREAM | Freq: Every day | CUTANEOUS | 11 refills | Status: DC

## 2018-01-09 NOTE — Addendum Note (Signed)
Addended by: Myriam Forehand on: 01/09/2018 11:30 AM   Modules accepted: Orders

## 2018-01-09 NOTE — Telephone Encounter (Signed)
Change this to Tretinoin 0.1% cream to apply qhs, call in 20 gm with 11 rf

## 2018-01-09 NOTE — Telephone Encounter (Signed)
Rx has been re-sent into pharmacy for the cream Rx and NOT gel

## 2018-03-15 ENCOUNTER — Ambulatory Visit (INDEPENDENT_AMBULATORY_CARE_PROVIDER_SITE_OTHER): Payer: Medicare Other | Admitting: Family Medicine

## 2018-03-15 ENCOUNTER — Encounter: Payer: Self-pay | Admitting: Family Medicine

## 2018-03-15 VITALS — BP 110/80 | HR 78 | Temp 98.1°F | Ht 75.0 in | Wt 219.2 lb

## 2018-03-15 DIAGNOSIS — R531 Weakness: Secondary | ICD-10-CM | POA: Diagnosis not present

## 2018-03-15 DIAGNOSIS — G4700 Insomnia, unspecified: Secondary | ICD-10-CM

## 2018-03-15 LAB — T3, FREE: T3 FREE: 3.8 pg/mL (ref 2.3–4.2)

## 2018-03-15 LAB — HEPATIC FUNCTION PANEL
ALT: 23 U/L (ref 0–53)
AST: 17 U/L (ref 0–37)
Albumin: 4.6 g/dL (ref 3.5–5.2)
Alkaline Phosphatase: 69 U/L (ref 39–117)
BILIRUBIN TOTAL: 0.6 mg/dL (ref 0.2–1.2)
Bilirubin, Direct: 0.1 mg/dL (ref 0.0–0.3)
Total Protein: 6.8 g/dL (ref 6.0–8.3)

## 2018-03-15 LAB — BASIC METABOLIC PANEL
BUN: 13 mg/dL (ref 6–23)
CO2: 29 mEq/L (ref 19–32)
CREATININE: 1.05 mg/dL (ref 0.40–1.50)
Calcium: 9.8 mg/dL (ref 8.4–10.5)
Chloride: 104 mEq/L (ref 96–112)
GFR: 77.75 mL/min (ref >60.00)
Glucose, Bld: 79 mg/dL (ref 70–99)
Potassium: 3.9 mEq/L (ref 3.5–5.1)
Sodium: 141 mEq/L (ref 135–145)

## 2018-03-15 LAB — CBC WITH DIFFERENTIAL/PLATELET
BASOS ABS: 0.1 10*3/uL (ref 0.0–0.1)
Basophils Relative: 0.9 % (ref 0.0–3.0)
EOS ABS: 0.3 10*3/uL (ref 0.0–0.7)
Eosinophils Relative: 5.1 % — ABNORMAL HIGH (ref 0.0–5.0)
HCT: 44.2 % (ref 39.0–52.0)
HEMOGLOBIN: 15.5 g/dL (ref 13.0–17.0)
Lymphocytes Relative: 18.4 % (ref 12.0–46.0)
Lymphs Abs: 1.1 10*3/uL (ref 0.7–4.0)
MCHC: 35.1 g/dL (ref 30.0–36.0)
MCV: 87.8 fl (ref 78.0–100.0)
MONO ABS: 0.3 10*3/uL (ref 0.1–1.0)
Monocytes Relative: 5.6 % (ref 3.0–12.0)
Neutro Abs: 4.2 10*3/uL (ref 1.4–7.7)
Neutrophils Relative %: 70 % (ref 43.0–77.0)
Platelets: 224 10*3/uL (ref 150.0–400.0)
RBC: 5.04 Mil/uL (ref 4.22–5.81)
RDW: 13.3 % (ref 11.5–15.5)
WBC: 6 10*3/uL (ref 4.0–10.5)

## 2018-03-15 LAB — T4, FREE: FREE T4: 0.84 ng/dL (ref 0.60–1.60)

## 2018-03-15 LAB — TSH: TSH: 2 u[IU]/mL (ref 0.35–4.50)

## 2018-03-15 LAB — VITAMIN B12

## 2018-03-15 LAB — VITAMIN D 25 HYDROXY (VIT D DEFICIENCY, FRACTURES): VITD: 39.86 ng/mL (ref 30.00–100.00)

## 2018-03-15 LAB — TESTOSTERONE: Testosterone: 245.96 ng/dL — ABNORMAL LOW (ref 300.00–890.00)

## 2018-03-15 MED ORDER — TEMAZEPAM 30 MG PO CAPS: 30.0000 mg | ORAL_CAPSULE | Freq: Every evening | ORAL | 2 refills | Status: DC | PRN

## 2018-03-15 NOTE — Progress Notes (Signed)
   Subjective:    Patient ID: Bernard James, male    DOB: Nov 10, 1960, 57 y.o.   MRN: [id number]  HPI Here to discuss chronic fatigue and poor sleep. He has spoken to [place] a number of times about fatigue in the past, but no specific causes have been found. He takes Xanax for anxiety but he still has trouble sleeping.   Review of Systems  Constitutional: Positive for fatigue.  Respiratory: Negative.   Cardiovascular: Negative.   Gastrointestinal: Negative.   Endocrine: Negative.   Genitourinary: Negative.   Neurological: Negative.   Psychiatric/Behavioral: Positive for sleep disturbance. Negative for dysphoric mood. The patient is nervous/anxious.        Objective:   Physical Exam  Constitutional: He is oriented to person, place, and time. He appears well-developed and well-nourished.  Neck: No thyromegaly present.  Cardiovascular: Normal rate, regular rhythm, normal heart sounds and intact distal pulses.  Pulmonary/Chest: Effort normal and breath sounds normal. No stridor. No respiratory distress. He has no wheezes. He has no rales.  Abdominal: Soft. Bowel sounds are normal. He exhibits no distension and no mass. There is no tenderness. There is no rebound and no guarding. No hernia.  Lymphadenopathy:    He has no cervical adenopathy.  Neurological: He is alert and oriented to person, place, and time.  Psychiatric: He has a normal mood and affect. His behavior is normal. Thought content normal.          Assessment & Plan:  Fatigue and insomnia. For sleep, he will try Temazepam. For the fatigue we will screen with labs.  Alysia Penna, MD

## 2018-03-19 ENCOUNTER — Telehealth: Payer: Self-pay

## 2018-03-19 MED ORDER — ALBUTEROL SULFATE HFA 108 (90 BASE) MCG/ACT IN AERS: 1.0000 | INHALATION_SPRAY | Freq: Four times a day (QID) | RESPIRATORY_TRACT | 11 refills | Status: DC | PRN

## 2018-03-19 NOTE — Telephone Encounter (Signed)
HFA has been sent into pt's quested pharmacy

## 2018-03-19 NOTE — Telephone Encounter (Signed)
Called and spoke with pt.   Pt is requesting a new Rx for Albuterol   Sent to [facility] [place] to PCP to advise

## 2018-03-19 NOTE — Telephone Encounter (Signed)
Call in #1 inhaler with 11 rf

## 2018-04-05 DIAGNOSIS — J45991 Cough variant asthma: Secondary | ICD-10-CM | POA: Diagnosis not present

## 2018-04-05 DIAGNOSIS — J019 Acute sinusitis, unspecified: Secondary | ICD-10-CM | POA: Diagnosis not present

## 2018-06-14 ENCOUNTER — Other Ambulatory Visit: Payer: Self-pay | Admitting: Family Medicine

## 2018-06-17 NOTE — Telephone Encounter (Signed)
Last OV 03/15/2018   Last refilled 05/17/2017 disp 90 with 5 refills   Sent to PCP to advise

## 2018-06-18 NOTE — Telephone Encounter (Signed)
Call in #90 with 5 rf 

## 2018-07-02 ENCOUNTER — Encounter: Payer: Self-pay | Admitting: Family Medicine

## 2018-07-02 ENCOUNTER — Ambulatory Visit (INDEPENDENT_AMBULATORY_CARE_PROVIDER_SITE_OTHER): Payer: Medicare Other | Admitting: Family Medicine

## 2018-07-02 VITALS — BP 122/82 | HR 79 | Temp 98.3°F | Wt 217.5 lb

## 2018-07-02 DIAGNOSIS — G4700 Insomnia, unspecified: Secondary | ICD-10-CM | POA: Diagnosis not present

## 2018-07-02 DIAGNOSIS — J3089 Other allergic rhinitis: Secondary | ICD-10-CM

## 2018-07-02 DIAGNOSIS — E291 Testicular hypofunction: Secondary | ICD-10-CM

## 2018-07-02 MED ORDER — TEMAZEPAM 15 MG PO CAPS: 15.0000 mg | ORAL_CAPSULE | Freq: Every evening | ORAL | 5 refills | Status: DC | PRN

## 2018-07-02 MED ORDER — METHYLPREDNISOLONE ACETATE 80 MG/ML IJ SUSP
80.0000 mg | Freq: Once | INTRAMUSCULAR | Status: AC
  Administered 2018-07-02: 80 mg via INTRAMUSCULAR

## 2018-07-02 NOTE — Progress Notes (Signed)
   Subjective:    Patient ID: Bernard James, male    DOB: 06-03-1961, 57 y.o.   MRN: [id number]  HPI Here for several issues. First he has had 5 days of stuffy head, PND, sneezing and a dry cough. No fever or ST. No blowing out mucus from the nose. He has taken Allegra with little benefit. Also the Temazepam he takes for sleep is too strong and he often feels sluggish the next morning. Lastly he wants to try another testosterone product besides Androgel.    Review of Systems  Constitutional: Negative.   HENT: Positive for postnasal drip and rhinorrhea. Negative for congestion, ear pain, sinus pressure, sinus pain and sore throat.   Eyes: Negative.   Respiratory: Positive for cough.   Neurological: Negative.        Objective:   Physical Exam  Constitutional: He is oriented to person, place, and time. He appears well-developed and well-nourished.  HENT:  Right Ear: External ear normal.  Left Ear: External ear normal.  Nose: Nose normal.  Mouth/Throat: Oropharynx is clear and moist.  Eyes: Conjunctivae are normal.  Neck: No thyromegaly present.  Pulmonary/Chest: Effort normal and breath sounds normal. No stridor. No respiratory distress. He has no wheezes. He has no rales.  Lymphadenopathy:    He has no cervical adenopathy.  Neurological: He is alert and oriented to person, place, and time.          Assessment & Plan:  His seasonal allergies have flared up. He is given a shot of Depomedrol. He will try Xyzal OTC. For sleep we will decrease the Temazepam to 15 mg qhs. For the hypogonadism, he will contact his insurance company to see what other forms of testosterone replacement they will cover.  Alysia Penna, MD

## 2018-07-03 ENCOUNTER — Telehealth: Payer: Self-pay | Admitting: Family Medicine

## 2018-07-03 NOTE — Telephone Encounter (Signed)
Copied from [place] [id number]. Topic: General - Other >> Jul 03, 2018  2:13 PM Keene Breath wrote: Reason for CRM: Patient called to speak with the nurse regarding his reaction to the shot that the doctor gave him yesterday for his sinus.  Patient stated that he is blowing yellowish mucous and it was clear yesterday when he first got the shot.  Patient believes he now has a sinus infection.  Please advise.  CB# [phone].

## 2018-07-04 NOTE — Telephone Encounter (Signed)
Dr Fry please advise. thanks 

## 2018-07-04 NOTE — Telephone Encounter (Signed)
Pt called yesterday and today about sending him a antibiotic for sinus infection please call back at [phone]

## 2018-07-05 ENCOUNTER — Ambulatory Visit (INDEPENDENT_AMBULATORY_CARE_PROVIDER_SITE_OTHER): Payer: Medicare Other | Admitting: Family Medicine

## 2018-07-05 ENCOUNTER — Encounter: Payer: Self-pay | Admitting: Family Medicine

## 2018-07-05 VITALS — BP 130/84 | HR 72 | Temp 97.5°F | Wt 217.0 lb

## 2018-07-05 DIAGNOSIS — J209 Acute bronchitis, unspecified: Secondary | ICD-10-CM

## 2018-07-05 MED ORDER — AMOXICILLIN 500 MG PO CAPS: 500.0000 mg | ORAL_CAPSULE | Freq: Three times a day (TID) | ORAL | 0 refills | Status: DC

## 2018-07-05 MED ORDER — METHYLPREDNISOLONE ACETATE 80 MG/ML IJ SUSP
80.0000 mg | Freq: Once | INTRAMUSCULAR | Status: AC
  Administered 2018-07-05: 80 mg via INTRAMUSCULAR

## 2018-07-05 NOTE — Progress Notes (Signed)
   Subjective:    Patient ID: Bernard James, male    DOB: [birth year], 57 y.o.   MRN: [id number]  HPI Here with one week of stuffy head, PND, and chest congestion. Now he is coughing up green sputum. No fever. On Mucinex.    Review of Systems  Constitutional: Negative.   HENT: Positive for congestion, postnasal drip and sinus pressure. Negative for sinus pain and sore throat.   Eyes: Negative.   Respiratory: Positive for cough, chest tightness and wheezing.        Objective:   Physical Exam  Constitutional: He appears well-developed and well-nourished.  HENT:  Right Ear: External ear normal.  Left Ear: External ear normal.  Nose: Nose normal.  Mouth/Throat: Oropharynx is clear and moist.  Eyes: Conjunctivae are normal.  Neck: No thyromegaly present.  Pulmonary/Chest: Effort normal. He has no rales.  Scattered rhonchi and wheezes  Lymphadenopathy:    He has no cervical adenopathy.          Assessment & Plan:  Bronchitis, treat with another DepoMedrol shot and Amoxicillin.  Alysia Penna, MD

## 2018-07-05 NOTE — Telephone Encounter (Signed)
Call in a Zpack  ?

## 2018-07-05 NOTE — Telephone Encounter (Signed)
No zpak was called in from this telephone encounter.  Pt was seen today by Dr. Sarajane Jews.

## 2018-08-16 ENCOUNTER — Ambulatory Visit (INDEPENDENT_AMBULATORY_CARE_PROVIDER_SITE_OTHER): Payer: Medicare Other

## 2018-08-16 DIAGNOSIS — Z23 Encounter for immunization: Secondary | ICD-10-CM

## 2018-09-29 ENCOUNTER — Encounter: Payer: Self-pay | Admitting: Gastroenterology

## 2018-11-19 ENCOUNTER — Ambulatory Visit (INDEPENDENT_AMBULATORY_CARE_PROVIDER_SITE_OTHER): Payer: Medicare Other | Admitting: Family Medicine

## 2018-11-19 ENCOUNTER — Encounter: Payer: Self-pay | Admitting: Family Medicine

## 2018-11-19 VITALS — BP 110/60 | HR 73 | Temp 98.4°F | Wt 214.4 lb

## 2018-11-19 DIAGNOSIS — J3089 Other allergic rhinitis: Secondary | ICD-10-CM

## 2018-11-19 MED ORDER — METHYLPREDNISOLONE ACETATE 80 MG/ML IJ SUSP
80.0000 mg | Freq: Once | INTRAMUSCULAR | Status: AC
  Administered 2018-11-19: 80 mg via INTRAMUSCULAR

## 2018-11-19 NOTE — Progress Notes (Signed)
   Subjective:    Patient ID: KAZUO DURNIL, male    DOB: February 02, 1961, 58 y.o.   MRN: [id number]  HPI Here for 2 weeks of sinus congestion and slight dizziness. No fever or headaches. No ST or cough. Using Flonase, which helps but causes nosebleeds. He also uses Allegra but it makes him sleepy.   Review of Systems  Constitutional: Negative.   HENT: Positive for congestion, postnasal drip and sinus pressure. Negative for sinus pain and sore throat.   Eyes: Negative.   Respiratory: Negative.        Objective:   Physical Exam Constitutional:      Appearance: Normal appearance. He is not ill-appearing.  HENT:     Right Ear: Tympanic membrane and ear canal normal.     Left Ear: Tympanic membrane and ear canal normal.     Nose: Congestion present.     Mouth/Throat:     Pharynx: Oropharynx is clear.  Eyes:     Conjunctiva/sclera: Conjunctivae normal.  Pulmonary:     Effort: Pulmonary effort is normal.     Breath sounds: Normal breath sounds.  Lymphadenopathy:     Cervical: No cervical adenopathy.  Neurological:     Mental Status: He is alert.           Assessment & Plan:  His allergies are acting up. He is given a steroid shot. I suggested he try Nasonex sprays daily instead of Flonase, and that he try Xyzal daily instead of Allegra.  Alysia Penna, MD

## 2018-12-24 DIAGNOSIS — H04123 Dry eye syndrome of bilateral lacrimal glands: Secondary | ICD-10-CM | POA: Diagnosis not present

## 2018-12-24 DIAGNOSIS — H5213 Myopia, bilateral: Secondary | ICD-10-CM | POA: Diagnosis not present

## 2018-12-24 DIAGNOSIS — H524 Presbyopia: Secondary | ICD-10-CM | POA: Diagnosis not present

## 2019-01-02 ENCOUNTER — Other Ambulatory Visit: Payer: Self-pay | Admitting: Family Medicine

## 2019-01-03 NOTE — Telephone Encounter (Signed)
Call in #90 with 5 rf 

## 2019-01-03 NOTE — Telephone Encounter (Signed)
Rx done. 

## 2019-01-03 NOTE — Telephone Encounter (Signed)
Last rx given on 9/10 for #90 with 5 ref

## 2019-02-13 ENCOUNTER — Telehealth: Payer: Self-pay | Admitting: *Deleted

## 2019-02-13 NOTE — Telephone Encounter (Signed)
Copied from [place] [id number]. Topic: General - Other >> Feb 13, 2019  3:39 PM Leward Quan A wrote: Reason for CRM: Patient called to tell Dr Sarajane Jews that the Testosterone (ANDROGEL PUMP) 20.25 MG/ACT (1.62%) GEL is still fine for him and the insurance will cover it.

## 2019-02-14 NOTE — Telephone Encounter (Signed)
Dr. Fry please advise on refill. thanks 

## 2019-02-17 MED ORDER — TESTOSTERONE 20.25 MG/ACT (1.62%) TD GEL: 4.0000 | Freq: Every day | TRANSDERMAL | 5 refills | Status: DC

## 2019-02-17 NOTE — Telephone Encounter (Signed)
Refill called to the pharmacy and left on the VM 

## 2019-02-17 NOTE — Telephone Encounter (Signed)
Call in 75 gm with 5 rf

## 2019-02-17 NOTE — Addendum Note (Signed)
Addended by: Elie Confer on: 02/17/2019 10:29 AM   Modules accepted: Orders

## 2019-02-24 ENCOUNTER — Telehealth: Payer: Self-pay | Admitting: Family Medicine

## 2019-02-24 NOTE — Telephone Encounter (Signed)
PA initiated today for the testosterone 1.62%

## 2019-02-27 NOTE — Telephone Encounter (Signed)
Patient is calling to check on the status of the PA. Please advise CB- [phone]

## 2019-03-05 NOTE — Telephone Encounter (Signed)
[phone] Pt calling to check on prior auth for med  Please call back

## 2019-03-06 NOTE — Telephone Encounter (Signed)
Left message informing the patient that the PA has been started and we are waiting for a response from his insurance company.   CRM created.

## 2019-03-07 ENCOUNTER — Other Ambulatory Visit: Payer: Self-pay

## 2019-03-07 ENCOUNTER — Ambulatory Visit (INDEPENDENT_AMBULATORY_CARE_PROVIDER_SITE_OTHER): Payer: Medicare Other | Admitting: Family Medicine

## 2019-03-07 ENCOUNTER — Encounter: Payer: Self-pay | Admitting: Family Medicine

## 2019-03-07 DIAGNOSIS — R6889 Other general symptoms and signs: Secondary | ICD-10-CM

## 2019-03-07 DIAGNOSIS — Z0184 Encounter for antibody response examination: Secondary | ICD-10-CM

## 2019-03-07 DIAGNOSIS — Z20828 Contact with and (suspected) exposure to other viral communicable diseases: Secondary | ICD-10-CM

## 2019-03-07 NOTE — Progress Notes (Signed)
Subjective:    Patient ID: Bernard James, male    DOB: 1961/03/06, 58 y.o.   MRN: [id number]  HPI Virtual Visit via Video Note  I connected with the patient on 03/07/19 at  2:30 PM EDT by a video enabled telemedicine application and verified that I am speaking with the correct person using two identifiers.  Location patient: home Location provider:work or home office Persons participating in the virtual visit: patient, provider  I discussed the limitations of evaluation and management by telemedicine and the availability of in person appointments. The patient expressed understanding and agreed to proceed.   HPI: Here asking to be tested for Covid-19 antibodies. We saw him in February for some symptoms that were felt to be related to his allergies. These resolved after about 2 weeks. He has felt fine since then. He worries that he may have contracted the Covid-19 virus at that time.    ROS: See pertinent positives and negatives per HPI.  Past Medical History:  Diagnosis Date  . Allergy    allergic rhinitis  . Anxiety   . Depression   . GERD (gastroesophageal reflux disease)   . Hyperlipidemia   . Stroke [facility])    in cerebellum  . Tension headache     Past Surgical History:  Procedure Laterality Date  . HERNIA REPAIR     bilateral, inguinal  . LIGAMENT REPAIR     repair of torn ligament in left ankle  . TONSILLECTOMY      Family History  Problem Relation Age of Onset  . Atrial fibrillation Father   . Hypertension Other   . Arthritis Other   . Fibromyalgia Other   . Cancer Other        lymphoma  . Colon cancer Neg Hx      Current Outpatient Medications:  .  albuterol (PROVENTIL HFA;VENTOLIN HFA) 108 (90 Base) MCG/ACT inhaler, Inhale 1 puff into the lungs every 6 (six) hours as needed., Disp: 18 g, Rfl: 11 .  ALPRAZolam (XANAX) 0.25 MG tablet, TAKE 1 TABLET BY MOUTH THREE TIMES DAILY AS NEEDED, Disp: 90 tablet, Rfl: 5 .  amoxicillin (AMOXIL) 500 MG capsule,  Take 1 capsule (500 mg total) by mouth 3 (three) times daily., Disp: 30 capsule, Rfl: 0 .  aspirin 81 MG tablet, Take 81 mg by mouth daily.  , Disp: , Rfl:  .  clindamycin (CLINDAGEL) 1 % gel, apply to affected area twice a day, Disp: 30 g, Rfl: 5 .  desoximetasone (TOPICORT) 0.25 % cream, Apply 1 application topically 2 (two) times daily., Disp: 30 g, Rfl: 5 .  famotidine (PEPCID) 20 MG tablet, Take 20 mg by mouth 2 (two) times daily. take half a tablet of the 20 MG twice daily, Disp: , Rfl:  .  fluticasone (FLONASE) 50 MCG/ACT nasal spray, Place 2 sprays into both nostrils daily as needed., Disp: 16 g, Rfl: 3 .  mupirocin cream (BACTROBAN) 2 %, Apply topically 3 (three) times daily., Disp: 15 g, Rfl: 11 .  temazepam (RESTORIL) 15 MG capsule, Take 1 capsule (15 mg total) by mouth at bedtime as needed for sleep., Disp: 30 capsule, Rfl: 5 .  Testosterone (ANDROGEL PUMP) 20.25 MG/ACT (1.62%) GEL, Place 4 Squirts onto the skin daily., Disp: 75 g, Rfl: 5 .  tretinoin (RETIN-A) 0.1 % cream, Apply topically at bedtime., Disp: 20 g, Rfl: 11  EXAM:  VITALS per patient if applicable:  GENERAL: alert, oriented, appears well and in no acute distress  HEENT: atraumatic, conjunttiva clear, no obvious abnormalities on inspection of external nose and ears  NECK: normal movements of the head and neck  LUNGS: on inspection no signs of respiratory distress, breathing rate appears normal, no obvious gross SOB, gasping or wheezing  CV: no obvious cyanosis  MS: moves all visible extremities without noticeable abnormality  PSYCH/NEURO: pleasant and cooperative, no obvious depression or anxiety, speech and thought processing grossly intact  ASSESSMENT AND PLAN: Possible exposure to the Covid-19 virus. We will test for IgG antibodies.  Alysia Penna, MD  Discussed the following assessment and plan:  Flu-like symptoms - Plan: SAR CoV2 Serology (COVID 19)AB(IGG)IA     I discussed the assessment and  treatment plan with the patient. The patient was provided an opportunity to ask questions and all were answered. The patient agreed with the plan and demonstrated an understanding of the instructions.   The patient was advised to call back or seek an in-person evaluation if the symptoms worsen or if the condition fails to improve as anticipated.     Review of Systems     Objective:   Physical Exam        Assessment & Plan:

## 2019-03-14 ENCOUNTER — Other Ambulatory Visit: Payer: Self-pay | Admitting: Family Medicine

## 2019-03-28 ENCOUNTER — Other Ambulatory Visit: Payer: Self-pay | Admitting: Family Medicine

## 2019-04-02 ENCOUNTER — Other Ambulatory Visit: Payer: Self-pay

## 2019-04-03 ENCOUNTER — Other Ambulatory Visit: Payer: Self-pay

## 2019-04-03 ENCOUNTER — Other Ambulatory Visit (INDEPENDENT_AMBULATORY_CARE_PROVIDER_SITE_OTHER): Payer: Medicare Other

## 2019-04-03 DIAGNOSIS — R6889 Other general symptoms and signs: Secondary | ICD-10-CM

## 2019-04-04 LAB — SAR COV2 SEROLOGY (COVID19)AB(IGG),IA: SARS CoV2 AB IGG: NEGATIVE

## 2019-04-09 ENCOUNTER — Telehealth: Payer: Self-pay | Admitting: Family Medicine

## 2019-04-09 ENCOUNTER — Encounter: Payer: Self-pay | Admitting: *Deleted

## 2019-04-09 NOTE — Telephone Encounter (Signed)
Medication Refill - Medication: tretinoin (RETIN-A) 0.1 % cream/Testosterone (ANDROGEL PUMP) 20.25 MG/ACT (1.62%) GEL / Pt needs prior authorization for both medications. Stated a PA has been needed for the testosterone since 02/17/19  Has the patient contacted their pharmacy? Yes.   (Agent: If no, request that the patient contact the pharmacy for the refill.) (Agent: If yes, when and what did the pharmacy advise?)  Preferred Pharmacy (with phone number or street name):  [id number] - [place], [place] - [place] [phone] (Phone) [phone] (Fax)     Agent: Please be advised that RX refills may take up to 3 business days. We ask that you follow-up with your pharmacy.

## 2019-04-09 NOTE — Telephone Encounter (Signed)
Patient called to get Negative for Covid antibodies test results sent to his address  [place]  [place] [address]

## 2019-04-09 NOTE — Telephone Encounter (Signed)
REFILL Testosterone (ANDROGEL PUMP) 20.25 MG/ACT (1.62%) GEL PHARMACY  [id number] - [place], [place] - [place] [phone] (Phone) [phone] (Fax)

## 2019-04-10 NOTE — Telephone Encounter (Signed)
Ria Comment do you have a PA for these meds on this pt?  Thanks

## 2019-04-10 NOTE — Telephone Encounter (Signed)
Please do a PA request if we haven't yet

## 2019-04-14 NOTE — Telephone Encounter (Signed)
Both PAs have been started 

## 2019-04-14 NOTE — Telephone Encounter (Signed)
Retin-A Key: [id number]

## 2019-04-14 NOTE — Telephone Encounter (Signed)
Testosterone Key: [id number]

## 2019-04-16 NOTE — Telephone Encounter (Signed)
Both PA's have been approved.   LM informing patient.

## 2019-08-04 ENCOUNTER — Encounter: Payer: Self-pay | Admitting: Family Medicine

## 2019-08-04 ENCOUNTER — Other Ambulatory Visit: Payer: Self-pay

## 2019-08-04 ENCOUNTER — Ambulatory Visit (INDEPENDENT_AMBULATORY_CARE_PROVIDER_SITE_OTHER): Payer: Medicare Other | Admitting: Family Medicine

## 2019-08-04 DIAGNOSIS — J301 Allergic rhinitis due to pollen: Secondary | ICD-10-CM

## 2019-08-04 DIAGNOSIS — J302 Other seasonal allergic rhinitis: Secondary | ICD-10-CM | POA: Insufficient documentation

## 2019-08-04 MED ORDER — METHYLPREDNISOLONE ACETATE 80 MG/ML IJ SUSP
80.0000 mg | Freq: Once | INTRAMUSCULAR | Status: AC
  Administered 2019-08-04: 80 mg via INTRAMUSCULAR

## 2019-08-04 MED ORDER — MONTELUKAST SODIUM 10 MG PO TABS: 10.0000 mg | ORAL_TABLET | Freq: Every day | ORAL | 3 refills | Status: DC

## 2019-08-04 NOTE — Progress Notes (Signed)
  Subjective:     Patient ID: Bernard James, male   DOB: [birth year], 58 y.o.   MRN: [id number]  HPI   Patient is seen with complaints of some nasal congestion and mild shortness of breath with activity which he states is typical of previous allergy flares.  He states he has been dealing with this most of his life.  He is not aware of any active wheezing.  At baseline, he takes [employer] account [employer].  He states he had allergy shots as a child.  He is particularly aware of ragweed allergy and states that most of the years eats fall he gets a shot of Depo-Medrol 80 mg which seems to help get him over the worse.  He is not aware of any prior use of Singulair or Astelin.  Denies any recent fevers or chills.  No chest pains.  Past Medical History:  Diagnosis Date  . Allergy    allergic rhinitis  . Anxiety   . Depression   . GERD (gastroesophageal reflux disease)   . Hyperlipidemia   . Stroke [facility])    in cerebellum  . Tension headache    Past Surgical History:  Procedure Laterality Date  . HERNIA REPAIR     bilateral, inguinal  . LIGAMENT REPAIR     repair of torn ligament in left ankle  . TONSILLECTOMY      reports that he has never smoked. He has never used smokeless tobacco. He reports that he does not drink alcohol or use drugs. family history includes Arthritis in an other family member; Atrial fibrillation in his father; Cancer in an other family member; Fibromyalgia in an other family member; Hypertension in an other family member. Allergies  Allergen Reactions  . Doxycycline Hyclate     REACTION: stomach upset  . Flagyl [Metronidazole] Other (See Comments)    Dizziness      Review of Systems  Constitutional: Negative for chills and fever.  HENT: Positive for congestion and postnasal drip. Negative for ear pain, facial swelling and sore throat.   Respiratory: Negative for wheezing.   Cardiovascular: Negative for chest pain.       Objective:   Physical Exam Vitals  signs reviewed.  Constitutional:      General: He is not in acute distress.    Appearance: Normal appearance. He is not ill-appearing.  HENT:     Right Ear: Tympanic membrane normal.     Left Ear: Tympanic membrane normal.  Cardiovascular:     Rate and Rhythm: Normal rate and regular rhythm.  Pulmonary:     Effort: Pulmonary effort is normal.     Breath sounds: Normal breath sounds. No wheezing or rales.  Neurological:     Mental Status: He is alert.        Assessment:     Seasonal allergic rhinitis    Plan:     -Continue Allegra and Flonase. -We discussed other possible additional medication such as Singulair and or Astelin -Patient requesting Depo-Medrol 80 mg IM which is helped his severe flareups in the past.  We agreed to this. -Follow-up for any persistent or worsening symptoms  Eulas Post MD [facility]

## 2019-08-04 NOTE — Patient Instructions (Signed)

## 2019-08-19 ENCOUNTER — Ambulatory Visit: Payer: Medicare Other

## 2019-08-19 ENCOUNTER — Other Ambulatory Visit: Payer: Self-pay

## 2019-08-30 ENCOUNTER — Other Ambulatory Visit: Payer: Self-pay | Admitting: Family Medicine

## 2019-09-02 NOTE — Telephone Encounter (Signed)
Okay for refill?  

## 2020-01-07 DIAGNOSIS — H5213 Myopia, bilateral: Secondary | ICD-10-CM | POA: Diagnosis not present

## 2020-01-07 DIAGNOSIS — H04123 Dry eye syndrome of bilateral lacrimal glands: Secondary | ICD-10-CM | POA: Diagnosis not present

## 2020-04-14 ENCOUNTER — Telehealth: Payer: Self-pay | Admitting: Family Medicine

## 2020-04-15 NOTE — Telephone Encounter (Signed)
Okay for refill?   Pt LOV 02/2019

## 2020-05-03 NOTE — Telephone Encounter (Signed)
Pt is calling in stating that the pharmacy is stating that he needs a PA for Rx tretinoin (RETIN-A) 0.1% cream that he uses for his face.

## 2020-05-05 NOTE — Telephone Encounter (Signed)
Attempted to start the PA. Cover my meds is down. Will continue to try and start this process.

## 2020-05-06 NOTE — Telephone Encounter (Signed)
PA has been started on cover my meds.  Key: [id number]

## 2020-05-11 NOTE — Telephone Encounter (Signed)
Approved on July 29 Effective from 05/06/2020 through 05/06/2021.

## 2020-05-21 ENCOUNTER — Encounter: Payer: Self-pay | Admitting: Family Medicine

## 2020-05-21 ENCOUNTER — Ambulatory Visit (INDEPENDENT_AMBULATORY_CARE_PROVIDER_SITE_OTHER): Payer: Medicare Other | Admitting: Family Medicine

## 2020-05-21 ENCOUNTER — Other Ambulatory Visit: Payer: Self-pay

## 2020-05-21 VITALS — BP 120/62 | HR 74 | Temp 98.1°F | Wt 209.6 lb

## 2020-05-21 DIAGNOSIS — F411 Generalized anxiety disorder: Secondary | ICD-10-CM | POA: Diagnosis not present

## 2020-05-21 DIAGNOSIS — N401 Enlarged prostate with lower urinary tract symptoms: Secondary | ICD-10-CM | POA: Diagnosis not present

## 2020-05-21 DIAGNOSIS — N138 Other obstructive and reflux uropathy: Secondary | ICD-10-CM

## 2020-05-21 DIAGNOSIS — G4700 Insomnia, unspecified: Secondary | ICD-10-CM

## 2020-05-21 DIAGNOSIS — E785 Hyperlipidemia, unspecified: Secondary | ICD-10-CM | POA: Diagnosis not present

## 2020-05-21 MED ORDER — CLINDAMYCIN PHOSPHATE 1 % EX GEL: CUTANEOUS | 11 refills | Status: DC

## 2020-05-21 MED ORDER — MONTELUKAST SODIUM 10 MG PO TABS: 10.0000 mg | ORAL_TABLET | Freq: Every day | ORAL | 3 refills | Status: DC

## 2020-05-21 MED ORDER — TRAZODONE HCL 50 MG PO TABS: 25.0000 mg | ORAL_TABLET | Freq: Every evening | ORAL | 5 refills | Status: DC | PRN

## 2020-05-21 NOTE — Progress Notes (Signed)
   Subjective:    Patient ID: Bernard James, male    DOB: 1961-10-06, 59 y.o.   MRN: [id number]  HPI Here to follow up on lipids and insomnia. He tried Temazepam and if helped him sleep, but it made him feel hung over the next day so he stopped it. He still takes Xanax every day. He tries to watch his diet.   Review of Systems  Constitutional: Negative.   Respiratory: Negative.   Cardiovascular: Negative.   Psychiatric/Behavioral: Positive for sleep disturbance. Negative for agitation, behavioral problems, confusion, decreased concentration, dysphoric mood and hallucinations. The patient is nervous/anxious.        Objective:   Physical Exam Constitutional:      Appearance: Normal appearance.  Cardiovascular:     Rate and Rhythm: Normal rate and regular rhythm.     Pulses: Normal pulses.     Heart sounds: Normal heart sounds.  Pulmonary:     Effort: Pulmonary effort is normal.     Breath sounds: Normal breath sounds.  Neurological:     Mental Status: He is alert.  Psychiatric:        Mood and Affect: Mood normal.        Behavior: Behavior normal.        Thought Content: Thought content normal.        Judgment: Judgment normal.           Assessment & Plan:  For the dyslipidemia, we will get labs today to include a lipid panel. For insomina, he will try Trazodone.  Alysia Penna, MD

## 2020-05-22 LAB — BASIC METABOLIC PANEL
BUN: 16 mg/dL (ref 7–25)
CO2: 27 mmol/L (ref 20–32)
Calcium: 9.8 mg/dL (ref 8.6–10.3)
Chloride: 103 mmol/L (ref 98–110)
Creat: 1.11 mg/dL (ref 0.70–1.33)
Glucose, Bld: 93 mg/dL (ref 65–99)
Potassium: 4 mmol/L (ref 3.5–5.3)
Sodium: 139 mmol/L (ref 135–146)

## 2020-05-22 LAB — LIPID PANEL
Cholesterol: 248 mg/dL — ABNORMAL HIGH (ref <200)
HDL: 39 mg/dL — ABNORMAL LOW (ref >=40)
LDL Cholesterol (Calc): 162 mg/dL (calc) — ABNORMAL HIGH
Non-HDL Cholesterol (Calc): 209 mg/dL (calc) — ABNORMAL HIGH (ref <130)
Total CHOL/HDL Ratio: 6.4 (calc) — ABNORMAL HIGH (ref <5.0)
Triglycerides: 285 mg/dL — ABNORMAL HIGH (ref <150)

## 2020-05-22 LAB — CBC WITH DIFFERENTIAL/PLATELET
Absolute Monocytes: 399 cells/uL (ref 200–950)
Basophils Absolute: 63 cells/uL (ref 0–200)
Basophils Relative: 1.1 %
Eosinophils Absolute: 291 cells/uL (ref 15–500)
Eosinophils Relative: 5.1 %
HCT: 44.9 % (ref 38.5–50.0)
Hemoglobin: 15.4 g/dL (ref 13.2–17.1)
Lymphs Abs: 929 cells/uL (ref 850–3900)
MCH: 30.8 pg (ref 27.0–33.0)
MCHC: 34.3 g/dL (ref 32.0–36.0)
MCV: 89.8 fL (ref 80.0–100.0)
MPV: 9.5 fL (ref 7.5–12.5)
Monocytes Relative: 7 %
Neutro Abs: 4019 cells/uL (ref 1500–7800)
Neutrophils Relative %: 70.5 %
Platelets: 213 10*3/uL (ref 140–400)
RBC: 5 10*6/uL (ref 4.20–5.80)
RDW: 12.6 % (ref 11.0–15.0)
Total Lymphocyte: 16.3 %
WBC: 5.7 10*3/uL (ref 3.8–10.8)

## 2020-05-22 LAB — TSH: TSH: 1.3 mIU/L (ref 0.40–4.50)

## 2020-05-22 LAB — HEPATIC FUNCTION PANEL
AG Ratio: 2 (calc) (ref 1.0–2.5)
ALT: 22 U/L (ref 9–46)
AST: 16 U/L (ref 10–35)
Albumin: 4.5 g/dL (ref 3.6–5.1)
Alkaline phosphatase (APISO): 67 U/L (ref 35–144)
Bilirubin, Direct: 0.1 mg/dL (ref 0.0–0.2)
Globulin: 2.2 g/dL (calc) (ref 1.9–3.7)
Indirect Bilirubin: 0.7 mg/dL (calc) (ref 0.2–1.2)
Total Bilirubin: 0.8 mg/dL (ref 0.2–1.2)
Total Protein: 6.7 g/dL (ref 6.1–8.1)

## 2020-05-22 LAB — PSA: PSA: 0.6 ng/mL (ref <=4.0)

## 2020-05-26 ENCOUNTER — Telehealth: Payer: Self-pay | Admitting: Family Medicine

## 2020-05-26 NOTE — Telephone Encounter (Signed)
The patient called to get his lab results from 08/13.  Please contact the patient to go over the results and send him a copy of his labs.

## 2020-05-27 NOTE — Telephone Encounter (Signed)
Left message for patient to call back. Labs have been printed and placed in the mail.

## 2020-05-28 NOTE — Telephone Encounter (Signed)
ATC, unable to leave a voice mail.  

## 2020-06-01 NOTE — Telephone Encounter (Signed)
Spoke with the patient. He is aware that all his labs were normal except his lipid panel was slightly elevated and to watch his diet. He has received the copy of is labs via mail.

## 2020-08-28 ENCOUNTER — Ambulatory Visit (INDEPENDENT_AMBULATORY_CARE_PROVIDER_SITE_OTHER): Payer: Medicare Other | Admitting: Family Medicine

## 2020-08-28 ENCOUNTER — Encounter: Payer: Self-pay | Admitting: Family Medicine

## 2020-08-28 ENCOUNTER — Other Ambulatory Visit: Payer: Self-pay

## 2020-08-28 ENCOUNTER — Ambulatory Visit: Payer: Medicare Other | Admitting: Family Medicine

## 2020-08-28 VITALS — BP 134/84 | HR 66 | Temp 98.0°F | Ht 75.0 in | Wt 212.8 lb

## 2020-08-28 DIAGNOSIS — J3089 Other allergic rhinitis: Secondary | ICD-10-CM

## 2020-08-28 MED ORDER — METHYLPREDNISOLONE ACETATE 80 MG/ML IJ SUSP
80.0000 mg | Freq: Once | INTRAMUSCULAR | Status: AC
  Administered 2020-08-28: 80 mg via INTRAMUSCULAR

## 2020-08-28 NOTE — Patient Instructions (Signed)
-  start your singulair at night.  -continue your other allergy medication -continue flonase -if eyes itch can get over the counter ketotifen drops  If no improvement please see pcp!  Nice meeting you!  Dr. Rogers Blocker

## 2020-08-28 NOTE — Progress Notes (Signed)
Patient: Bernard James MRN: [id number] DOB: 09-13-1961 PCP: Laurey Morale, MD     Subjective:  Chief Complaint  Patient presents with  . Breathing Problem    Starting Thursday  . Shortness of Breath  . Allergies    seasonal    HPI: The patient is a 59 y.o. male who presents today for breathing issues, due to allergies. He has used Flonase nasal spray, albuterol inhaler, and still has not seen a change. He states on Thursday he was outside and it was windy. He had cut bushes, raked leaves. Thursday night he started to have tightness in chest, itchy eyes and fatigue. He had no shortness of breath or cough. Has had to use his inhaler more than normal. He is on allergy medication. He has not started the singulair that Dr. Sarajane Jews sent in. He has been using his flonase. He has had no fever/chills, no sick contacts.   Review of Systems  Constitutional: Negative for chills, fatigue and fever.  HENT: Positive for congestion and postnasal drip. Negative for facial swelling, sinus pressure, sinus pain and sore throat.   Eyes: Positive for redness and itching. Negative for discharge.  Respiratory: Positive for shortness of breath. Negative for cough and wheezing.   Cardiovascular: Negative for chest pain and palpitations.  Gastrointestinal: Negative for abdominal pain.    Allergies Patient is allergic to doxycycline hyclate and flagyl [metronidazole].  Past Medical History Patient  has a past medical history of Allergy, Anxiety, Depression, GERD (gastroesophageal reflux disease), Hyperlipidemia, Stroke ([place]), and Tension headache.  Surgical History Patient  has a past surgical history that includes Hernia repair; Tonsillectomy; and Ligament repair.  Family History Pateint's family history includes Arthritis in an other family member; Atrial fibrillation in his father; Cancer in an other family member; Fibromyalgia in an other family member; Hypertension in an other family member; Sjogren's  syndrome in his sister.  Social History Patient  reports that he has never smoked. He has never used smokeless tobacco. He reports that he does not drink alcohol and does not use drugs.    Objective: Vitals:   08/28/20 1203 08/28/20 1229  BP: (!) 141/90 134/84  Pulse: 66   Temp: 98 F (36.7 C)   TempSrc: Temporal   SpO2: 98%   Weight: 212 lb 12.8 oz (96.5 kg)   Height: 6\' 3"  (1.905 m)     Body mass index is 26.6 kg/m.  Physical Exam Vitals reviewed.  Constitutional:      General: He is not in acute distress.    Appearance: He is well-developed and normal weight. He is not ill-appearing.  HENT:     Head: Normocephalic and atraumatic.     Comments: Mild turbinate edema bilaterally    Mouth/Throat:     Mouth: Mucous membranes are moist.  Eyes:     Extraocular Movements: Extraocular movements intact.     Pupils: Pupils are equal, round, and reactive to light.  Cardiovascular:     Rate and Rhythm: Normal rate and regular rhythm.     Heart sounds: No murmur heard.   Pulmonary:     Effort: Pulmonary effort is normal.     Breath sounds: Normal breath sounds.  Abdominal:     General: Bowel sounds are normal.     Palpations: Abdomen is soft.  Musculoskeletal:     Cervical back: Normal range of motion and neck supple.  Lymphadenopathy:     Cervical: No cervical adenopathy.  Skin:    General: Skin  is warm.     Capillary Refill: Capillary refill takes less than 2 seconds.     Findings: No rash.  Neurological:     General: No focal deficit present.     Mental Status: He is alert.  Psychiatric:        Mood and Affect: Mood normal.        Behavior: Behavior normal.        Assessment/plan: 1. Environmental and seasonal allergies Encouraged him to start his singulair at night, continue other allergy medication in the AM. Continue flonase nightly.  Steroid shot today. If eyes itching can try otc ketotifen. Already using thera tears. Refilled inhaler. Precautions given.  F/u with pcp if allergy symptoms continue despite max treatment.   - methylPREDNISolone acetate (DEPO-MEDROL) injection 80 mg    This visit occurred during the SARS-CoV-2 public health emergency.  Safety protocols were in place, including screening questions prior to the visit, additional usage of staff PPE, and extensive cleaning of exam room while observing appropriate contact time as indicated for disinfecting solutions.     Return if symptoms worsen or fail to improve.    Orma Flaming, MD [place]   08/28/2020

## 2020-09-13 ENCOUNTER — Ambulatory Visit: Payer: Medicare Other

## 2020-09-29 ENCOUNTER — Telehealth: Payer: Self-pay | Admitting: Family Medicine

## 2020-09-29 NOTE — Telephone Encounter (Signed)
Left message for patient to call back and schedule Medicare Annual Wellness Visit (AWV) either virtually or in office.   Last AWV no information please schedule at anytime with [facility] Nurse Health Advisor 1 or 2   This should be a 45 minute visit. 

## 2020-10-26 ENCOUNTER — Ambulatory Visit: Payer: Medicare Other

## 2020-12-08 ENCOUNTER — Other Ambulatory Visit: Payer: Self-pay | Admitting: Family Medicine

## 2020-12-08 NOTE — Telephone Encounter (Signed)
Last office visit- 05/21/2020 Last refill- 04/19/2020--90 tabs with 5 refills  Next office visit- Not scheduled

## 2021-02-08 DIAGNOSIS — H5213 Myopia, bilateral: Secondary | ICD-10-CM | POA: Diagnosis not present

## 2021-02-08 DIAGNOSIS — H04123 Dry eye syndrome of bilateral lacrimal glands: Secondary | ICD-10-CM | POA: Diagnosis not present

## 2021-06-02 ENCOUNTER — Ambulatory Visit (INDEPENDENT_AMBULATORY_CARE_PROVIDER_SITE_OTHER): Payer: Medicare Other | Admitting: Family Medicine

## 2021-06-02 ENCOUNTER — Other Ambulatory Visit: Payer: Self-pay

## 2021-06-02 ENCOUNTER — Telehealth: Payer: Medicare Other | Admitting: Family Medicine

## 2021-06-02 ENCOUNTER — Encounter: Payer: Self-pay | Admitting: Family Medicine

## 2021-06-02 VITALS — BP 118/80 | HR 60 | Temp 98.0°F | Wt 216.0 lb

## 2021-06-02 DIAGNOSIS — Z8673 Personal history of transient ischemic attack (TIA), and cerebral infarction without residual deficits: Secondary | ICD-10-CM | POA: Diagnosis not present

## 2021-06-02 DIAGNOSIS — F411 Generalized anxiety disorder: Secondary | ICD-10-CM | POA: Diagnosis not present

## 2021-06-02 DIAGNOSIS — Z8679 Personal history of other diseases of the circulatory system: Secondary | ICD-10-CM

## 2021-06-02 MED ORDER — FLUTICASONE PROPIONATE 50 MCG/ACT NA SUSP: 2.0000 | Freq: Every day | NASAL | 11 refills | Status: DC | PRN

## 2021-06-02 MED ORDER — TRETINOIN 0.1 % EX CREA: TOPICAL_CREAM | CUTANEOUS | 11 refills | Status: DC

## 2021-06-02 MED ORDER — LORAZEPAM 1 MG PO TABS
1.0000 mg | ORAL_TABLET | Freq: Three times a day (TID) | ORAL | 5 refills | Status: DC | PRN
Start: 2021-06-02 — End: 2021-06-20

## 2021-06-02 MED ORDER — CLINDAMYCIN PHOSPHATE 1 % EX GEL: CUTANEOUS | 11 refills | Status: DC

## 2021-06-02 NOTE — Progress Notes (Signed)
   Subjective:    Patient ID: Bernard James, male    DOB: 03/09/1961, 60 y.o.   MRN: [id number]  HPI Here for several issues. First he thinks he had a panic attack 2 days ago while he was having lunch with friends at a restaurant. He has dealt with anxiety for years, and he often has trouble with large groups of people or with loud nisues. He says that day the restaurant was crowded and very noisy, and he suddenly felt tightness in his chest and his heart started beating rapidly. He got up and went outside for fresh air. After 2-3 minutes it stopped and it has not happened since then. He has been taking Xanax for years, usually at night to help him sleep. Sometimes he takes this during the day, but he cannot function when he does this because it makes him sleepy. He has read that Ativan is not as sedating, and he asks if he can try it. He has no chest pain or SOB on exertion. Also he asks if we should look at his vertebral arteries again. A vertebral dissection was what caused hi stroke some year ago. His last carotid study was in 2018.    Review of Systems  Constitutional: Negative.   Respiratory:  Positive for chest tightness. Negative for cough, shortness of breath and wheezing.   Cardiovascular:  Positive for palpitations. Negative for chest pain and leg swelling.  Psychiatric/Behavioral:  Negative for agitation, behavioral problems, confusion, decreased concentration, dysphoric mood and hallucinations. The patient is nervous/anxious.       Objective:   Physical Exam Constitutional:      General: He is not in acute distress.    Appearance: Normal appearance.  Cardiovascular:     Rate and Rhythm: Normal rate and regular rhythm.     Pulses: Normal pulses.     Heart sounds: Normal heart sounds.  Pulmonary:     Effort: Pulmonary effort is normal.     Breath sounds: Normal breath sounds.  Neurological:     General: No focal deficit present.     Mental Status: He is alert and oriented to  person, place, and time.  Psychiatric:        Behavior: Behavior normal.        Thought Content: Thought content normal.     Comments: Somewhat anxious           Assessment & Plan:  It does sound like he had a true panic attack. We will stop the Xanax and switch to Ativan. He will use this for sleep but also for daytime anxiety if needed. We will set up another carotid US soon. We will follow up on these issues at his well exam next month. We spent 35 minutes reviewing records and discussing these issues.   Alysia Penna, MD

## 2021-06-02 NOTE — Telephone Encounter (Signed)
Patient stated that the pharmacy asked for prior authorization for the medication he was prescribed today.   Medication: LORazepam (ATIVAN) 1 MG tablet [id number]    The pharmacy is [id number] - [place], [place]  [address] [place] [address]    The patient number is [phone].   Please advise.

## 2021-06-03 NOTE — Telephone Encounter (Signed)
Pt Rx was filled on 06/02/2021 at her visit

## 2021-06-14 NOTE — Telephone Encounter (Signed)
PT called to advise that for the past week or so he has been fighting with the Rx and [employer] to get a PA done to get his medicine cover. PT advise he needs the following meds refill:  ALPRAZolam (XANAX) 0.25 MG tablet: Just needs a refill of  tretinoin (RETIN-A) 0.1 % cream: PA is possibly needed to refill  LORazepam (ATIVAN) 1 MG tablet: PA is still needed as [employer] states they do not have a PA request on file and PT is now out of their meds  Please advise.

## 2021-06-15 ENCOUNTER — Telehealth: Payer: Self-pay

## 2021-06-15 NOTE — Telephone Encounter (Signed)
Spoke with pt aware that PA on both Lorazepam and  Tretinoin cream has been sent to his insurance for approval, advised pt that he will be notified when the approval is back

## 2021-06-15 NOTE — Telephone Encounter (Signed)
Patient called today stating that [facility] is needing prior authorizations for the following medications:tretinoin (RETIN-A) 0.1 % cream and LORazepam (ATIVAN) 1 MG tablet.    Patient states that he also needs a refill of alprazolam to be sent to the pharmacy because he is almost out of this medication.  Patient would like a call at [phone] once prior authorizations and the refill has been sent in.  Please advise.

## 2021-06-15 NOTE — Telephone Encounter (Addendum)
PA started KEY: [id number] Tretinoin 0.1% cream  Key: [id number] Lorazepam '1MG'$  tablet  Approved

## 2021-06-16 NOTE — Telephone Encounter (Signed)
Rx Bin: [id number] PCN: # [id number] Group: [place] Part D ID: [id number]

## 2021-06-16 NOTE — Telephone Encounter (Signed)
Spoke with pt pharmacy to refill pt prescriptions for Lorazepam and Tretinoin, pt aware that Rx have been approved and he should pick up from his pharmacy, verbalized understanding

## 2021-06-20 ENCOUNTER — Encounter: Payer: Self-pay | Admitting: Family Medicine

## 2021-06-20 ENCOUNTER — Ambulatory Visit (INDEPENDENT_AMBULATORY_CARE_PROVIDER_SITE_OTHER): Payer: Medicare Other | Admitting: Family Medicine

## 2021-06-20 ENCOUNTER — Other Ambulatory Visit: Payer: Self-pay

## 2021-06-20 VITALS — BP 118/80 | HR 62 | Temp 98.5°F | Ht 75.5 in | Wt 216.0 lb

## 2021-06-20 DIAGNOSIS — R739 Hyperglycemia, unspecified: Secondary | ICD-10-CM

## 2021-06-20 DIAGNOSIS — J3089 Other allergic rhinitis: Secondary | ICD-10-CM

## 2021-06-20 DIAGNOSIS — E559 Vitamin D deficiency, unspecified: Secondary | ICD-10-CM | POA: Diagnosis not present

## 2021-06-20 DIAGNOSIS — N138 Other obstructive and reflux uropathy: Secondary | ICD-10-CM | POA: Diagnosis not present

## 2021-06-20 DIAGNOSIS — K219 Gastro-esophageal reflux disease without esophagitis: Secondary | ICD-10-CM

## 2021-06-20 DIAGNOSIS — Z86010 Personal history of colonic polyps: Secondary | ICD-10-CM | POA: Diagnosis not present

## 2021-06-20 DIAGNOSIS — N401 Enlarged prostate with lower urinary tract symptoms: Secondary | ICD-10-CM | POA: Diagnosis not present

## 2021-06-20 DIAGNOSIS — F411 Generalized anxiety disorder: Secondary | ICD-10-CM

## 2021-06-20 DIAGNOSIS — Z8673 Personal history of transient ischemic attack (TIA), and cerebral infarction without residual deficits: Secondary | ICD-10-CM

## 2021-06-20 DIAGNOSIS — R42 Dizziness and giddiness: Secondary | ICD-10-CM | POA: Diagnosis not present

## 2021-06-20 DIAGNOSIS — E785 Hyperlipidemia, unspecified: Secondary | ICD-10-CM | POA: Diagnosis not present

## 2021-06-20 DIAGNOSIS — E291 Testicular hypofunction: Secondary | ICD-10-CM | POA: Diagnosis not present

## 2021-06-20 MED ORDER — ALPRAZOLAM 0.25 MG PO TABS: 0.2500 mg | ORAL_TABLET | Freq: Every evening | ORAL | 1 refills | Status: DC | PRN

## 2021-06-20 MED ORDER — ALPRAZOLAM ER 0.5 MG PO TB24: 0.5000 mg | ORAL_TABLET | Freq: Every day | ORAL | 5 refills | Status: DC

## 2021-06-20 MED ORDER — ALBUTEROL SULFATE HFA 108 (90 BASE) MCG/ACT IN AERS: 1.0000 | INHALATION_SPRAY | Freq: Four times a day (QID) | RESPIRATORY_TRACT | 11 refills | Status: DC | PRN

## 2021-06-20 MED ORDER — SERTRALINE HCL 25 MG PO TABS: 25.0000 mg | ORAL_TABLET | Freq: Every day | ORAL | 5 refills | Status: DC

## 2021-06-20 NOTE — Progress Notes (Signed)
Subjective:    Patient ID: Bernard James, male    DOB: 1961-08-30, 60 y.o.   MRN: [id number]  HPI Here to follow up on issues. His anxiety continues to be a problem. He tried Lorazepam, but he had to stop it because it made him "feel like a zombie". The Xanax he has been taking for years works well, but he struggles during th periods just before the next dose. He has also been dealing with some depression, since he lost both of his parents during the past year. He asks to try a very low dose of an antidepressant medication. His GERD and asthma are stable. His allergies are stable. His dizziness has clamed down the past few months .   Review of Systems  Constitutional: Negative.   HENT: Negative.    Eyes: Negative.   Respiratory: Negative.    Cardiovascular: Negative.   Gastrointestinal: Negative.   Genitourinary: Negative.   Musculoskeletal: Negative.   Skin: Negative.   Neurological: Negative.   Psychiatric/Behavioral:  Positive for dysphoric mood and sleep disturbance. Negative for self-injury and suicidal ideas. The patient is nervous/anxious.       Objective:   Physical Exam Constitutional:      General: He is not in acute distress.    Appearance: Normal appearance. He is well-developed. He is not diaphoretic.  HENT:     Head: Normocephalic and atraumatic.     Right Ear: External ear normal.     Left Ear: External ear normal.     Nose: Nose normal.     Mouth/Throat:     Pharynx: No oropharyngeal exudate.  Eyes:     General: No scleral icterus.       Right eye: No discharge.        Left eye: No discharge.     Conjunctiva/sclera: Conjunctivae normal.     Pupils: Pupils are equal, round, and reactive to light.  Neck:     Thyroid: No thyromegaly.     Vascular: No JVD.     Trachea: No tracheal deviation.  Cardiovascular:     Rate and Rhythm: Normal rate and regular rhythm.     Heart sounds: Normal heart sounds. No murmur heard.   No friction rub. No gallop.   Pulmonary:     Effort: Pulmonary effort is normal. No respiratory distress.     Breath sounds: Normal breath sounds. No wheezing or rales.  Chest:     Chest wall: No tenderness.  Abdominal:     General: Bowel sounds are normal. There is no distension.     Palpations: Abdomen is soft. There is no mass.     Tenderness: There is no abdominal tenderness. There is no guarding or rebound.  Genitourinary:    Penis: Normal. No tenderness.      Testes: Normal.     Prostate: Normal.     Rectum: Normal. Guaiac result negative.  Musculoskeletal:        General: No tenderness. Normal range of motion.     Cervical back: Neck supple.  Lymphadenopathy:     Cervical: No cervical adenopathy.  Skin:    General: Skin is warm and dry.     Coloration: Skin is not pale.     Findings: No erythema or rash.  Neurological:     Mental Status: He is alert and oriented to person, place, and time.     Cranial Nerves: No cranial nerve deficit.     Motor: No abnormal muscle tone.  Coordination: Coordination normal.     Deep Tendon Reflexes: Reflexes are normal and symmetric. Reflexes normal.  Psychiatric:        Behavior: Behavior normal.        Thought Content: Thought content normal.        Judgment: Judgment normal.          Assessment & Plan:  For his anxiety, we will try Xanax XR in the mornings to provide more continuous delivery during the day. He can still use an immediate release Xanax at bedtime for sleep. For depression he will try Zoloft 25 mg daily. His asthma and allergies and GERD and dizziness are stable. He is scheduled for a carotid doppler study on 06-30-21. We will set hi up for another colonoscopy. Get fasting labs to check lipids, etc today. We spent 35 minutes reviewing records and discussing these issues.  Alysia Penna, MD

## 2021-06-21 LAB — CBC WITH DIFFERENTIAL/PLATELET
Basophils Absolute: 0.1 10*3/uL (ref 0.0–0.1)
Basophils Relative: 1 % (ref 0.0–3.0)
Eosinophils Absolute: 0.2 10*3/uL (ref 0.0–0.7)
Eosinophils Relative: 3.6 % (ref 0.0–5.0)
HCT: 43.5 % (ref 39.0–52.0)
Hemoglobin: 14.9 g/dL (ref 13.0–17.0)
Lymphocytes Relative: 18.4 % (ref 12.0–46.0)
Lymphs Abs: 1 10*3/uL (ref 0.7–4.0)
MCHC: 34.2 g/dL (ref 30.0–36.0)
MCV: 88.8 fl (ref 78.0–100.0)
Monocytes Absolute: 0.4 10*3/uL (ref 0.1–1.0)
Monocytes Relative: 6.6 % (ref 3.0–12.0)
Neutro Abs: 4 10*3/uL (ref 1.4–7.7)
Neutrophils Relative %: 70.4 % (ref 43.0–77.0)
Platelets: 215 10*3/uL (ref 150.0–400.0)
RBC: 4.9 Mil/uL (ref 4.22–5.81)
RDW: 13.3 % (ref 11.5–15.5)
WBC: 5.7 10*3/uL (ref 4.0–10.5)

## 2021-06-21 LAB — LIPID PANEL
Cholesterol: 211 mg/dL — ABNORMAL HIGH (ref 0–200)
HDL: 39.6 mg/dL (ref >39.00)
LDL Cholesterol: 147 mg/dL — ABNORMAL HIGH (ref 0–99)
NonHDL: 170.92
Total CHOL/HDL Ratio: 5
Triglycerides: 119 mg/dL (ref 0.0–149.0)
VLDL: 23.8 mg/dL (ref 0.0–40.0)

## 2021-06-21 LAB — HEPATIC FUNCTION PANEL
ALT: 26 U/L (ref 0–53)
AST: 17 U/L (ref 0–37)
Albumin: 4.4 g/dL (ref 3.5–5.2)
Alkaline Phosphatase: 64 U/L (ref 39–117)
Bilirubin, Direct: 0.1 mg/dL (ref 0.0–0.3)
Total Bilirubin: 0.8 mg/dL (ref 0.2–1.2)
Total Protein: 6.6 g/dL (ref 6.0–8.3)

## 2021-06-21 LAB — TSH: TSH: 1.4 u[IU]/mL (ref 0.35–5.50)

## 2021-06-21 LAB — TESTOSTERONE: Testosterone: 328.25 ng/dL (ref 300.00–890.00)

## 2021-06-21 LAB — BASIC METABOLIC PANEL
BUN: 14 mg/dL (ref 6–23)
CO2: 27 mEq/L (ref 19–32)
Calcium: 9.4 mg/dL (ref 8.4–10.5)
Chloride: 106 mEq/L (ref 96–112)
Creatinine, Ser: 1.09 mg/dL (ref 0.40–1.50)
GFR: 74.59 mL/min (ref >60.00)
Glucose, Bld: 94 mg/dL (ref 70–99)
Potassium: 3.9 mEq/L (ref 3.5–5.1)
Sodium: 141 mEq/L (ref 135–145)

## 2021-06-21 LAB — VITAMIN D 25 HYDROXY (VIT D DEFICIENCY, FRACTURES): VITD: 47.2 ng/mL (ref 30.00–100.00)

## 2021-06-21 LAB — HEMOGLOBIN A1C: Hgb A1c MFr Bld: 5.2 % (ref 4.6–6.5)

## 2021-06-21 LAB — PSA: PSA: 0.66 ng/mL (ref 0.10–4.00)

## 2021-06-23 ENCOUNTER — Telehealth: Payer: Self-pay

## 2021-06-23 NOTE — Telephone Encounter (Signed)
Patient called stating after talking to the pharmacy he was told PA is needed to refill ALPRAZolam (XANAX XR) 0.5 MG 24 hr tablet

## 2021-06-24 NOTE — Telephone Encounter (Signed)
Your information has been submitted to [place]. [employer] [place] will review the request and notify you of the determination decision directly, typically within 3 business days of your submission and once all necessary information is received.  If [employer] [place] has not responded within the specified timeframe or if you have any questions about your PA submission, contact [facility] directly at [facility]) [phone] or ([place]) [phone].

## 2021-06-28 NOTE — Telephone Encounter (Signed)
Medication has been approved on September 16.      Effective from 06/24/2021 through 06/24/2022.  Letter will be mailed to patient.

## 2021-06-30 ENCOUNTER — Other Ambulatory Visit: Payer: Self-pay

## 2021-06-30 ENCOUNTER — Ambulatory Visit (INDEPENDENT_AMBULATORY_CARE_PROVIDER_SITE_OTHER): Payer: Medicare Other

## 2021-06-30 DIAGNOSIS — Z8673 Personal history of transient ischemic attack (TIA), and cerebral infarction without residual deficits: Secondary | ICD-10-CM

## 2021-06-30 DIAGNOSIS — Z8679 Personal history of other diseases of the circulatory system: Secondary | ICD-10-CM

## 2021-07-01 ENCOUNTER — Encounter: Payer: Medicare Other | Admitting: Family Medicine

## 2021-07-01 ENCOUNTER — Telehealth: Payer: Self-pay | Admitting: Family Medicine

## 2021-07-01 NOTE — Telephone Encounter (Signed)
Spoke with patient about [place] results

## 2021-07-01 NOTE — Telephone Encounter (Signed)
PT called back to get his imaging results he says. Please advise.

## 2021-07-22 ENCOUNTER — Telehealth: Payer: Self-pay | Admitting: Family Medicine

## 2021-07-22 MED ORDER — ALPRAZOLAM 0.25 MG PO TABS: 0.2500 mg | ORAL_TABLET | Freq: Three times a day (TID) | ORAL | 5 refills | Status: DC | PRN

## 2021-07-22 NOTE — Telephone Encounter (Signed)
I refilled the 0.25 mg tablets. Please cal the pharmacy to cancel any remaining refills for Xanax XR 0.5 mg

## 2021-07-22 NOTE — Telephone Encounter (Signed)
Last office visit- 06/20/21

## 2021-07-22 NOTE — Telephone Encounter (Signed)
Pt is calling and would like to go back to alprazolam 0.25 mg up to 3 pills a day instead of .50mg  once a day. Please advise

## 2021-07-28 NOTE — Telephone Encounter (Signed)
Spoke with Bernard James with pt pharmacy advised to cancel Rx for Xanax 0.5 mg per Dr Sarajane Jews

## 2021-08-13 ENCOUNTER — Other Ambulatory Visit: Payer: Self-pay | Admitting: Family Medicine

## 2021-08-30 ENCOUNTER — Other Ambulatory Visit: Payer: Self-pay

## 2021-08-30 ENCOUNTER — Ambulatory Visit (INDEPENDENT_AMBULATORY_CARE_PROVIDER_SITE_OTHER): Payer: Medicare Other | Admitting: Adult Health

## 2021-08-30 ENCOUNTER — Encounter: Payer: Self-pay | Admitting: Adult Health

## 2021-08-30 VITALS — BP 126/82 | HR 73

## 2021-08-30 DIAGNOSIS — J3089 Other allergic rhinitis: Secondary | ICD-10-CM | POA: Diagnosis not present

## 2021-08-30 DIAGNOSIS — J3489 Other specified disorders of nose and nasal sinuses: Secondary | ICD-10-CM | POA: Diagnosis not present

## 2021-08-30 DIAGNOSIS — H6502 Acute serous otitis media, left ear: Secondary | ICD-10-CM | POA: Diagnosis not present

## 2021-08-30 DIAGNOSIS — R0989 Other specified symptoms and signs involving the circulatory and respiratory systems: Secondary | ICD-10-CM | POA: Diagnosis not present

## 2021-08-30 LAB — POCT INFLUENZA A/B
Influenza A, POC: NEGATIVE
Influenza B, POC: NEGATIVE

## 2021-08-30 LAB — POC COVID19 BINAXNOW: SARS Coronavirus 2 Ag: NEGATIVE

## 2021-08-30 MED ORDER — ALBUTEROL SULFATE (2.5 MG/3ML) 0.083% IN NEBU
2.5000 mg | INHALATION_SOLUTION | Freq: Four times a day (QID) | RESPIRATORY_TRACT | 0 refills | Status: DC | PRN
Start: 2021-08-30 — End: 2022-01-09

## 2021-08-30 MED ORDER — METHYLPREDNISOLONE ACETATE 40 MG/ML IJ SUSP
40.0000 mg | Freq: Once | INTRAMUSCULAR | Status: AC
Start: 2021-08-30 — End: 2021-08-30
  Administered 2021-08-30: 40 mg via INTRAMUSCULAR

## 2021-08-30 MED ORDER — AMOXICILLIN 875 MG PO TABS: 875.0000 mg | ORAL_TABLET | Freq: Two times a day (BID) | ORAL | 0 refills | Status: AC

## 2021-08-30 MED ORDER — METHYLPREDNISOLONE SODIUM SUCC 40 MG IJ SOLR: 40.0000 mg | Freq: Once | INTRAMUSCULAR | Status: DC

## 2021-08-30 NOTE — Progress Notes (Signed)
Negative flu and covid POCT

## 2021-08-30 NOTE — Patient Instructions (Signed)
Allergies, Adult An allergy means that your body reacts to something that bothers it (allergen). This can happen from something that you eat, breathe in, or touch. Allergies often affect the nose, eyes, skin, and stomach. They can be mild, moderate, or very bad (severe). An allergy cannot spread from person to person. They can happen at any age. Sometimes, people outgrow them. What are the causes? Outdoor things, such as pollen, car fumes, and mold. Indoor things, such as dust, smoke, mold, and pets. Foods. Medicines. Things that bother your skin, such as perfume and bug bites. What increases the risk? Having family members with allergies or asthma. What are the signs or symptoms? Symptoms depend on how bad your allergy is. Mild to moderate symptoms Runny nose, stuffy nose, or sneezing. Itchy mouth, ears, or throat. A feeling of mucus dripping down the back of your throat. Sore throat. Eyes that are itchy, red, watery, or puffy. A skin rash, or red, swollen areas of skin (hives). Stomach cramps or bloating. Severe symptoms Very bad allergies to food, medicine, or bug bites may cause a very bad allergy reaction (anaphylaxis). This can be life-threatening. Symptoms include: A red face. Wheezing or coughing. Swollen lips, tongue, or mouth. Tight or swollen throat. Chest pain or tightness, or a fast heartbeat. Trouble breathing or shortness of breath. Pain in your belly (abdomen), vomiting, or watery poop (diarrhea). Feeling dizzy or fainting. How is this treated?   Treatment for this condition depends on your symptoms. Treatment may include: Cold, wet cloths for itching and swelling. Eye drops, nose sprays, or skin creams. Washing out your nose each day. A humidifier. Medicines. A change to the foods you eat. Being exposed again and again to tiny amounts of allergens. This helps your body get used to them. You might have: Allergy shots. Very small amounts of allergen put under  your tongue. An emergency shot (auto-injector pen) if you have a very bad allergy reaction. This is a medicine with a needle. You can put it into your skin by yourself. Your doctor will teach you how to use it. Follow these instructions at home: Medicines  Take or apply over-the-counter and prescription medicines only as told by your doctor. If you are at risk for a very bad allergy reaction, keep an auto-injector pen with you all the time. Eating and drinking Follow instructions from your doctor about what to eat and drink. Drink enough fluid to keep your pee (urine) pale yellow. General instructions If you have ever had a very bad allergy reaction, wear a medical alert bracelet or necklace. Stay away from things that you are allergic to. Keep all follow-up visits as told by your doctor. This is important. Contact a doctor if: Your symptoms do not get better with treatment. Get help right away if: You have symptoms of a very bad allergy reaction. These include: A swollen mouth, tongue, or throat. Pain or tightness in your chest. Trouble breathing. Being short of breath. Dizziness. Fainting. Very bad pain in your belly. Vomiting. Watery poop. These symptoms may be an emergency. Do not wait to see if the symptoms will go away. Get medical help right away. Call your local emergency services (911 in the [place] Do not drive yourself to the hospital. Summary Take or apply over-the-counter and prescription medicines only as told by your doctor. Stay away from things you are allergic to. If you are at risk for a very bad allergy reaction, carry an auto-injector pen all the time. Wear a  medical alert bracelet or necklace. Very bad allergy reactions can be life-threatening. Get help right away. This information is not intended to replace advice given to you by your health care provider. Make sure you discuss any questions you have with your health care provider. Document Revised:  08/06/2019 Document Reviewed: 08/06/2019 Elsevier Patient Education  [place]. Otitis Media, Adult Otitis media occurs when there is inflammation and fluid in the middle ear with signs and symptoms of an acute infection. The middle ear is a part of the ear that contains bones for hearing as well as air that helps send sounds to the brain. When infected fluid builds up in this space, it causes pressure and can lead to an ear infection. The eustachian tube connects the middle ear to the back of the nose (nasopharynx) and normally allows air into the middle ear. If the eustachian tube becomes blocked, fluid can build up and become infected. What are the causes? This condition is caused by a blockage in the eustachian tube. This can be caused by mucus or by swelling of the tube. Problems that can cause a blockage include: A cold or other upper respiratory infection. Allergies. An irritant, such as tobacco smoke. Enlarged adenoids. The adenoids are areas of soft tissue located high in the back of the throat, behind the nose and the roof of the mouth. They are part of the body's defense system (immune system). A mass in the nasopharynx. Damage to the ear caused by pressure changes (barotrauma). What increases the risk? You are more likely to develop this condition if you: Smoke or are exposed to tobacco smoke. Have an opening in the roof of your mouth (cleft palate). Have gastroesophageal reflux. Have an immune system disorder. What are the signs or symptoms? Symptoms of this condition include: Ear pain. Fever. Decreased hearing. Tiredness (lethargy). Fluid leaking from the ear, if the eardrum is ruptured or has burst. Ringing in the ear. How is this diagnosed? This condition is diagnosed with a physical exam. During the exam, your health care provider will use an instrument called an otoscope to look in your ear and check for redness, swelling, and fluid. He or she will also ask about  your symptoms. Your health care provider may also order tests, such as: A pneumatic otoscopy. This is a test to check the movement of the eardrum. It is done by squeezing a small amount of air into the ear. A tympanogram. This is a test that shows how well the eardrum moves in response to air pressure in the ear canal. It provides a graph for your health care provider to review. How is this treated? This condition can go away on its own within 3-5 days. But if the condition is caused by a bacterial infection and does not go away on its own, or if it keeps coming back, your health care provider may: Prescribe antibiotic medicine to treat the infection. Prescribe or recommend medicines to control pain. Follow these instructions at home: Take over-the-counter and prescription medicines only as told by your health care provider. If you were prescribed an antibiotic medicine, take it as told by your health care provider. Do not stop taking the antibiotic even if you start to feel better. Keep all follow-up visits. This is important. Contact a health care provider if: You have bleeding from your nose. There is a lump on your neck. You are not feeling better in 5 days. You feel worse instead of better. Get help right  away if: You have severe pain that is not controlled with medicine. You have swelling, redness, or pain around your ear. You have stiffness in your neck. A part of your face is not moving (paralyzed). The bone behind your ear (mastoid bone) is tender when you touch it. You develop a severe headache. Summary Otitis media is redness, soreness, and swelling of the middle ear, usually resulting in pain and decreased hearing. This condition can go away on its own within 3-5 days. If the problem does not go away in 3-5 days, your health care provider may give you medicines to treat the infection. If you were prescribed an antibiotic medicine, take it as told by your health care  provider. Follow all instructions that were given to you by your health care provider. This information is not intended to replace advice given to you by your health care provider. Make sure you discuss any questions you have with your health care provider. Document Revised: 01/03/2021 Document Reviewed: 01/03/2021 Elsevier Patient Education  [place].

## 2021-08-30 NOTE — Progress Notes (Signed)
Acute Office Visit  Subjective:    Patient ID: Bernard James, male    DOB: 06/06/1961, 60 y.o.   MRN: [id number]  Chief Complaint  Patient presents with   Sinus Problem    Pt states he has some sinus pressure and chest congestion.Symptoms started about 2 weeks ago    Onset around 2 weeks ago.  URI  The current episode started 1 to 4 weeks ago. The problem has been gradually worsening. There has been no fever. Associated symptoms include congestion, coughing, ear pain, a plugged ear sensation, rhinorrhea, sinus pain and swollen glands. Pertinent negatives include no abdominal pain, chest pain, diarrhea, dysuria, headaches (sinus), joint pain, joint swelling, nausea, neck pain, rash, sneezing, sore throat, vomiting or wheezing. He has tried decongestant and acetaminophen for the symptoms. The treatment provided mild relief.  Post nasal drip.  Nasal congestion.   Has been using nebulizer.   Seasonal allergies.  Still taking chronic allergy medications.  Denies any covid or flu exposure.    Patient  denies any fever, body aches,chills, rash, chest pain, shortness of breath, nausea, vomiting, or diarrhea.    Past Medical History:  Diagnosis Date   Allergy    allergic rhinitis   Anxiety    Depression    GERD (gastroesophageal reflux disease)    Hyperlipidemia    Stroke ([place])    in cerebellum   Tension headache     Past Surgical History:  Procedure Laterality Date   HERNIA REPAIR     bilateral, inguinal   LIGAMENT REPAIR     repair of torn ligament in left ankle   TONSILLECTOMY      Family History  Problem Relation Age of Onset   Atrial fibrillation Father    Hypertension Other    Arthritis Other    Fibromyalgia Other    Cancer Other        lymphoma   Sjogren's syndrome Sister    Colon cancer Neg Hx     Social History   Socioeconomic History   Marital status: Single    Spouse name: Not on file   Number of children: Not on file   Years of education: Not  on file   Highest education level: Not on file  Occupational History   Occupation: disabled  Tobacco Use   Smoking status: Never   Smokeless tobacco: Never  Substance and Sexual Activity   Alcohol use: No    Alcohol/week: 0.0 standard drinks   Drug use: No   Sexual activity: Not on file  Other Topics Concern   Not on file  Social History Narrative   Not on file   Social Determinants of Health   Financial Resource Strain: Not on file  Food Insecurity: Not on file  Transportation Needs: Not on file  Physical Activity: Not on file  Stress: Not on file  Social Connections: Not on file  Intimate Partner Violence: Not on file    Outpatient Medications Prior to Visit  Medication Sig Dispense Refill   ALPRAZolam (XANAX) 0.25 MG tablet Take 1 tablet (0.25 mg total) by mouth 3 (three) times daily as needed for anxiety. 90 tablet 5   aspirin 81 MG tablet Take 81 mg by mouth daily.       clindamycin (CLINDAGEL) 1 % gel APPLY TO AFFECTED AREA TWICE A DAY 30 g 11   desoximetasone (TOPICORT) 0.25 % cream Apply 1 application topically 2 (two) times daily. 30 g 5   famotidine (PEPCID) 20 MG  tablet Take 20 mg by mouth 2 (two) times daily. take half a tablet of the 20 MG twice daily     fluticasone (FLONASE) 50 MCG/ACT nasal spray Place 2 sprays into both nostrils daily as needed. 16 g 11   mupirocin cream (BACTROBAN) 2 % Apply topically 3 (three) times daily. 15 g 11   sertraline (ZOLOFT) 25 MG tablet Take 1 tablet (25 mg total) by mouth daily. 30 tablet 5   Testosterone (ANDROGEL PUMP) 20.25 MG/ACT (1.62%) GEL Place 4 Squirts onto the skin daily. 75 g 5   tretinoin (RETIN-A) 0.1 % cream APPLY EXTERNALLY TO THE AFFECTED AREA AT BEDTIME 20 g 11   albuterol (VENTOLIN HFA) 108 (90 Base) MCG/ACT inhaler Inhale 1 puff into the lungs every 6 (six) hours as needed. 18 g 11   No facility-administered medications prior to visit.    Allergies  Allergen Reactions   Doxycycline Hyclate      REACTION: stomach upset   Flagyl [Metronidazole] Other (See Comments)    Dizziness     Review of Systems  Constitutional: Negative.   HENT:  Positive for congestion, ear pain, rhinorrhea, sinus pressure and sinus pain. Negative for dental problem, drooling, ear discharge, facial swelling, hearing loss, mouth sores, nosebleeds, postnasal drip, sneezing, sore throat, tinnitus and trouble swallowing.   Respiratory:  Positive for cough. Negative for apnea, choking, chest tightness, shortness of breath, wheezing and stridor.   Cardiovascular:  Negative for chest pain, palpitations and leg swelling.  Gastrointestinal:  Negative for abdominal pain, diarrhea, nausea and vomiting.  Genitourinary: Negative.  Negative for dysuria.  Musculoskeletal: Negative.  Negative for joint pain and neck pain.  Skin:  Negative for rash.  Neurological: Negative.  Negative for headaches (sinus).  Psychiatric/Behavioral: Negative.        Objective:    Physical Exam Vitals reviewed.  Constitutional:      General: He is not in acute distress.    Appearance: He is well-developed. He is not diaphoretic.  HENT:     Head: Normocephalic and atraumatic.     Jaw: There is normal jaw occlusion.     Salivary Glands: Right salivary gland is not diffusely enlarged or tender. Left salivary gland is not diffusely enlarged or tender.     Right Ear: A middle ear effusion is present. Tympanic membrane is not perforated or erythematous.     Left Ear: A middle ear effusion is present. Tympanic membrane is erythematous. Tympanic membrane is not perforated.     Nose: Mucosal edema and rhinorrhea present. Rhinorrhea is purulent.     Right Turbinates: Pale.     Left Turbinates: Pale.     Right Sinus: Maxillary sinus tenderness present. No frontal sinus tenderness.     Left Sinus: Maxillary sinus tenderness present. No frontal sinus tenderness.     Mouth/Throat:     Lips: Pink.     Mouth: Mucous membranes are dry. No angioedema.      Pharynx: Oropharynx is clear. Uvula midline. No oropharyngeal exudate or posterior oropharyngeal erythema.     Tonsils: No tonsillar exudate or tonsillar abscesses.  Eyes:     General: No scleral icterus.       Right eye: No discharge.        Left eye: No discharge.     Conjunctiva/sclera: Conjunctivae normal.     Pupils: Pupils are equal, round, and reactive to light.  Neck:     Vascular: No JVD.     Trachea: No tracheal deviation.  Cardiovascular:     Rate and Rhythm: Normal rate and regular rhythm.     Heart sounds: Normal heart sounds. No murmur heard.   No friction rub. No gallop.  Pulmonary:     Effort: Pulmonary effort is normal. No respiratory distress.     Breath sounds: Normal breath sounds. No stridor. No wheezing or rales.  Chest:     Chest wall: No tenderness.  Abdominal:     General: Bowel sounds are normal.     Palpations: Abdomen is soft.  Musculoskeletal:        General: Normal range of motion.     Cervical back: Normal range of motion and neck supple.  Lymphadenopathy:     Cervical: No cervical adenopathy.  Skin:    General: Skin is warm and dry.     Coloration: Skin is not pale.     Findings: No erythema or rash.  Neurological:     Mental Status: He is alert and oriented to person, place, and time.     Cranial Nerves: No cranial nerve deficit.     Motor: No abnormal muscle tone.     Coordination: Coordination normal.     Deep Tendon Reflexes: Reflexes normal.  Psychiatric:        Behavior: Behavior normal.        Thought Content: Thought content normal.        Judgment: Judgment normal.    BP 126/82   Pulse 73   SpO2 98%  Wt Readings from Last 3 Encounters:  06/20/21 216 lb (98 kg)  06/02/21 216 lb (98 kg)  08/28/20 212 lb 12.8 oz (96.5 kg)    Health Maintenance Due  Topic Date Due   Pneumococcal Vaccine 28-27 Years old (1 - PCV) Never done   HIV Screening  Never done   Hepatitis C Screening  Never done   COLONOSCOPY (Pts 45-26yrs  Insurance coverage will need to be confirmed)  08/24/2018    There are no preventive care reminders to display for this patient.   Lab Results  Component Value Date   TSH 1.40 06/20/2021   Lab Results  Component Value Date   WBC 5.7 06/20/2021   HGB 14.9 06/20/2021   HCT 43.5 06/20/2021   MCV 88.8 06/20/2021   PLT 215.0 06/20/2021   Lab Results  Component Value Date   NA 141 06/20/2021   K 3.9 06/20/2021   CO2 27 06/20/2021   GLUCOSE 94 06/20/2021   BUN 14 06/20/2021   CREATININE 1.09 06/20/2021   BILITOT 0.8 06/20/2021   ALKPHOS 64 06/20/2021   AST 17 06/20/2021   ALT 26 06/20/2021   PROT 6.6 06/20/2021   ALBUMIN 4.4 06/20/2021   CALCIUM 9.4 06/20/2021   ANIONGAP 9 08/24/2017   GFR 74.59 06/20/2021   Lab Results  Component Value Date   CHOL 211 (H) 06/20/2021   Lab Results  Component Value Date   HDL 39.60 06/20/2021   Lab Results  Component Value Date   LDLCALC 147 (H) 06/20/2021   Lab Results  Component Value Date   TRIG 119.0 06/20/2021   Lab Results  Component Value Date   CHOLHDL 5 06/20/2021   Lab Results  Component Value Date   HGBA1C 5.2 06/20/2021       Assessment & Plan:   Problem List Items Addressed This Visit       Respiratory   Chest congestion   Relevant Orders   POCT Influenza A/B (Completed)   POC COVID-19  BinaxNow   CBC with Differential/Platelet     Nervous and Auditory   Non-recurrent acute serous otitis media of left ear - Primary   Relevant Medications   amoxicillin (AMOXIL) 875 MG tablet   Other Relevant Orders   CBC with Differential/Platelet     Other   Environmental and seasonal allergies   Relevant Medications   albuterol (PROVENTIL) (2.5 MG/3ML) 0.083% nebulizer solution   Sinus pressure       Meds ordered this encounter  Medications   albuterol (PROVENTIL) (2.5 MG/3ML) 0.083% nebulizer solution    Sig: Take 3 mLs (2.5 mg total) by nebulization every 6 (six) hours as needed for wheezing or  shortness of breath.    Dispense:  150 mL    Refill:  0   amoxicillin (AMOXIL) 875 MG tablet    Sig: Take 1 tablet (875 mg total) by mouth 2 (two) times daily for 10 days.    Dispense:  20 tablet    Refill:  0   DISCONTD: methylPREDNISolone sodium succinate (SOLU-MEDROL) 40 mg/mL injection 40 mg   methylPREDNISolone acetate (DEPO-MEDROL) injection 40 mg   Patient reports he did not take Augmentin due to stomach upset, but he does well with amoxicillin for sinusitis.  He has intense sinus pressure he request Depo-Medrol injection to help relieve this.  He also has a history of chronic seasonal allergies and he has a flare around this time yearly he reports.  He is taking allergy medications as directed by his primary care provider he reports.  Negative for COVID and influenza in office. Versus using his inhaler he has been using albuterol nebulizers as needed at home he is using these once daily.  He reports he feels good relief with these he denies any increasing chest congestion or chest pain.  Denies any wheezing or chest tightness. Patient reported he cannot do oral steroids but he does well with an injection.  Effects discussed with patient patient verbalized understanding.  Red Flags discussed. The patient was given clear instructions to go to ER or return to medical center if any red flags develop, symptoms do not improve, worsen or new problems develop. They verbalized understanding.  Return in about 1 week (around 09/06/2021), or if symptoms worsen or fail to improve, for at any time for any worsening symptoms, Go to Emergency room/ urgent care if worse.    Marcille Buffy, FNP

## 2021-08-31 LAB — CBC WITH DIFFERENTIAL/PLATELET
Basophils Absolute: 0 10*3/uL (ref 0.0–0.1)
Basophils Relative: 0.7 % (ref 0.0–3.0)
Eosinophils Absolute: 0.2 10*3/uL (ref 0.0–0.7)
Eosinophils Relative: 4 % (ref 0.0–5.0)
HCT: 43.6 % (ref 39.0–52.0)
Hemoglobin: 15.1 g/dL (ref 13.0–17.0)
Lymphocytes Relative: 18.8 % (ref 12.0–46.0)
Lymphs Abs: 1.1 10*3/uL (ref 0.7–4.0)
MCHC: 34.6 g/dL (ref 30.0–36.0)
MCV: 88.2 fl (ref 78.0–100.0)
Monocytes Absolute: 0.4 10*3/uL (ref 0.1–1.0)
Monocytes Relative: 6.5 % (ref 3.0–12.0)
Neutro Abs: 4.2 10*3/uL (ref 1.4–7.7)
Neutrophils Relative %: 70 % (ref 43.0–77.0)
Platelets: 235 10*3/uL (ref 150.0–400.0)
RBC: 4.95 Mil/uL (ref 4.22–5.81)
RDW: 13.3 % (ref 11.5–15.5)
WBC: 5.9 10*3/uL (ref 4.0–10.5)

## 2021-09-06 ENCOUNTER — Telehealth: Payer: Self-pay | Admitting: Family Medicine

## 2021-09-06 NOTE — Telephone Encounter (Signed)
Pt called in stating that he didn't receive medication (albuterol (PROVENTIL) (2.5 MG/3ML) 0.083% nebulizer solution). Pt stated that the pharmacy advise him that they never received the order from the provider. Pt requesting callback for confirmation.

## 2021-09-07 NOTE — Telephone Encounter (Signed)
Medication confirmed at pharmacy .

## 2022-01-09 ENCOUNTER — Ambulatory Visit (INDEPENDENT_AMBULATORY_CARE_PROVIDER_SITE_OTHER): Payer: Medicare Other | Admitting: Family Medicine

## 2022-01-09 ENCOUNTER — Encounter: Payer: Self-pay | Admitting: Family Medicine

## 2022-01-09 VITALS — BP 128/80 | HR 78 | Temp 98.7°F | Wt 221.0 lb

## 2022-01-09 DIAGNOSIS — G4700 Insomnia, unspecified: Secondary | ICD-10-CM

## 2022-01-09 DIAGNOSIS — J3089 Other allergic rhinitis: Secondary | ICD-10-CM

## 2022-01-09 MED ORDER — ZOLPIDEM TARTRATE 10 MG PO TABS: 10.0000 mg | ORAL_TABLET | Freq: Every evening | ORAL | 2 refills | Status: DC | PRN

## 2022-01-09 MED ORDER — ALPRAZOLAM 0.25 MG PO TABS: 0.2500 mg | ORAL_TABLET | Freq: Three times a day (TID) | ORAL | 5 refills | Status: DC | PRN

## 2022-01-09 MED ORDER — METHYLPREDNISOLONE ACETATE 80 MG/ML IJ SUSP
80.0000 mg | Freq: Once | INTRAMUSCULAR | Status: AC
  Administered 2022-01-09: 80 mg via INTRAMUSCULAR

## 2022-01-09 MED ORDER — ALBUTEROL SULFATE (2.5 MG/3ML) 0.083% IN NEBU: 2.5000 mg | INHALATION_SOLUTION | Freq: Four times a day (QID) | RESPIRATORY_TRACT | 5 refills | Status: AC | PRN

## 2022-01-09 NOTE — Addendum Note (Signed)
Addended by: Wyvonne Lenz on: 01/09/2022 04:58 PM ? ? Modules accepted: Orders ? ?

## 2022-01-09 NOTE — Progress Notes (Signed)
? ?  Subjective:  ? ? Patient ID: Bernard James, male    DOB: 30-Dec-1960, 61 y.o.   MRN: [id number] ? ?HPI ?Here for several weeks of sinus congestion and PND. No fever or ST or cough. He is taking Allegra. Every spring his allergies act up like this. Also he wants to try Ambien for sleep.  ? ? ?Review of Systems  ?Constitutional: Negative.   ?HENT:  Positive for congestion, postnasal drip and sinus pressure. Negative for sore throat.   ?Eyes: Negative.   ?Respiratory: Negative.    ? ?   ?Objective:  ? Physical Exam ?Constitutional:   ?   Appearance: Normal appearance. He is not ill-appearing.  ?HENT:  ?   Right Ear: Tympanic membrane, ear canal and external ear normal.  ?   Left Ear: Tympanic membrane, ear canal and external ear normal.  ?   Nose: Nose normal.  ?   Mouth/Throat:  ?   Pharynx: Oropharynx is clear.  ?Eyes:  ?   Conjunctiva/sclera: Conjunctivae normal.  ?Pulmonary:  ?   Effort: Pulmonary effort is normal.  ?   Breath sounds: Normal breath sounds.  ?Lymphadenopathy:  ?   Cervical: No cervical adenopathy.  ?Neurological:  ?   Mental Status: He is alert.  ? ? ? ? ? ?   ?Assessment & Plan:  ?For his allergies, he is given a shot of DepoMedrol. For  insomnia, he will try Zolpidem at bedtime.  ?Alysia Penna, MD ? ? ?

## 2022-03-03 DIAGNOSIS — M25572 Pain in left ankle and joints of left foot: Secondary | ICD-10-CM | POA: Diagnosis not present

## 2022-03-03 DIAGNOSIS — M79672 Pain in left foot: Secondary | ICD-10-CM | POA: Diagnosis not present

## 2022-03-03 DIAGNOSIS — M67962 Unspecified disorder of synovium and tendon, left lower leg: Secondary | ICD-10-CM | POA: Diagnosis not present

## 2022-04-06 DIAGNOSIS — M67962 Unspecified disorder of synovium and tendon, left lower leg: Secondary | ICD-10-CM | POA: Diagnosis not present

## 2022-05-03 ENCOUNTER — Encounter: Payer: Self-pay | Admitting: Family Medicine

## 2022-05-03 ENCOUNTER — Ambulatory Visit (INDEPENDENT_AMBULATORY_CARE_PROVIDER_SITE_OTHER): Payer: Medicare Other | Admitting: Family Medicine

## 2022-05-03 VITALS — BP 110/80 | HR 74 | Temp 98.5°F | Wt 220.0 lb

## 2022-05-03 DIAGNOSIS — J019 Acute sinusitis, unspecified: Secondary | ICD-10-CM | POA: Diagnosis not present

## 2022-05-03 MED ORDER — AMOXICILLIN 500 MG PO CAPS: 500.0000 mg | ORAL_CAPSULE | Freq: Three times a day (TID) | ORAL | 0 refills | Status: AC

## 2022-05-03 MED ORDER — DESOXIMETASONE 0.25 % EX CREA: 1.0000 | TOPICAL_CREAM | Freq: Two times a day (BID) | CUTANEOUS | 5 refills | Status: AC

## 2022-05-03 MED ORDER — METHYLPREDNISOLONE ACETATE 80 MG/ML IJ SUSP
80.0000 mg | Freq: Once | INTRAMUSCULAR | Status: AC
  Administered 2022-05-03: 80 mg via INTRAMUSCULAR

## 2022-05-03 NOTE — Progress Notes (Signed)
   Subjective:    Patient ID: Bernard James, male    DOB: [birth year], 61 y.o.   MRN: [id number]  HPI  Here for 5 days of sinus pressure, PND, ST and a dry cough. No fever. Using Mucinex DM.   Review of Systems  Constitutional: Negative.   HENT:  Positive for congestion, postnasal drip, sinus pressure and sore throat. Negative for ear pain.   Eyes: Negative.   Respiratory: Negative.         Objective:   Physical Exam Constitutional:      Appearance: Normal appearance.  HENT:     Right Ear: Tympanic membrane, ear canal and external ear normal.     Left Ear: Tympanic membrane, ear canal and external ear normal.     Nose: Nose normal.     Mouth/Throat:     Pharynx: Oropharynx is clear.  Eyes:     Conjunctiva/sclera: Conjunctivae normal.  Pulmonary:     Effort: Pulmonary effort is normal.     Breath sounds: Normal breath sounds.  Lymphadenopathy:     Cervical: No cervical adenopathy.  Neurological:     Mental Status: He is alert.           Assessment & Plan:  Sinusitis, treat with 10 days of Amoxicillin. Given a shot of DepoMedrol.  Alysia Penna, MD

## 2022-05-03 NOTE — Addendum Note (Signed)
Addended by: Wyvonne Lenz on: 05/03/2022 04:52 PM   Modules accepted: Orders

## 2022-05-24 ENCOUNTER — Other Ambulatory Visit: Payer: Self-pay | Admitting: Family Medicine

## 2022-05-24 DIAGNOSIS — J3089 Other allergic rhinitis: Secondary | ICD-10-CM

## 2022-07-24 ENCOUNTER — Ambulatory Visit (INDEPENDENT_AMBULATORY_CARE_PROVIDER_SITE_OTHER): Payer: Medicare Other | Admitting: Family Medicine

## 2022-07-24 ENCOUNTER — Encounter: Payer: Self-pay | Admitting: Family Medicine

## 2022-07-24 VITALS — BP 124/82 | HR 67 | Temp 97.5°F | Wt 224.0 lb

## 2022-07-24 DIAGNOSIS — E559 Vitamin D deficiency, unspecified: Secondary | ICD-10-CM | POA: Diagnosis not present

## 2022-07-24 DIAGNOSIS — G4730 Sleep apnea, unspecified: Secondary | ICD-10-CM

## 2022-07-24 DIAGNOSIS — E538 Deficiency of other specified B group vitamins: Secondary | ICD-10-CM

## 2022-07-24 DIAGNOSIS — R739 Hyperglycemia, unspecified: Secondary | ICD-10-CM

## 2022-07-24 DIAGNOSIS — E785 Hyperlipidemia, unspecified: Secondary | ICD-10-CM | POA: Diagnosis not present

## 2022-07-24 DIAGNOSIS — R5383 Other fatigue: Secondary | ICD-10-CM

## 2022-07-24 DIAGNOSIS — Z8249 Family history of ischemic heart disease and other diseases of the circulatory system: Secondary | ICD-10-CM | POA: Diagnosis not present

## 2022-07-24 NOTE — Progress Notes (Signed)
   Subjective:    Patient ID: Bernard James, male    DOB: Jun 05, 1961, 61 y.o.   MRN: [id number]  HPI Here for generalized fatigue. He says he feels tired all the time. He has felt this way about 6 months. He often has trouble sleeping. His medications have not changed. He asks if there is a way to screen him for heart disease.    Review of Systems  Constitutional:  Positive for fatigue.  Respiratory: Negative.    Cardiovascular: Negative.   Gastrointestinal: Negative.   Genitourinary: Negative.        Objective:   Physical Exam Constitutional:      Appearance: Normal appearance.  Cardiovascular:     Rate and Rhythm: Normal rate and regular rhythm.     Pulses: Normal pulses.     Heart sounds: Normal heart sounds.  Pulmonary:     Effort: Pulmonary effort is normal.     Breath sounds: Normal breath sounds.  Neurological:     Mental Status: He is alert and oriented to person, place, and time. Mental status is at baseline.           Assessment & Plan:  Fatigue. We well get labs to rule out anemia, thyroid disorders, etc. We will also send him for a sleep study. I suspect he has some sleep apnea. We will set up a cardiac calcium CT to screen for CAD.  Alysia Penna, MD

## 2022-07-25 LAB — HEPATIC FUNCTION PANEL
ALT: 40 U/L (ref 0–53)
AST: 23 U/L (ref 0–37)
Albumin: 4.7 g/dL (ref 3.5–5.2)
Alkaline Phosphatase: 73 U/L (ref 39–117)
Bilirubin, Direct: 0.1 mg/dL (ref 0.0–0.3)
Total Bilirubin: 0.7 mg/dL (ref 0.2–1.2)
Total Protein: 7.3 g/dL (ref 6.0–8.3)

## 2022-07-25 LAB — BASIC METABOLIC PANEL
BUN: 11 mg/dL (ref 6–23)
CO2: 27 mEq/L (ref 19–32)
Calcium: 9.9 mg/dL (ref 8.4–10.5)
Chloride: 104 mEq/L (ref 96–112)
Creatinine, Ser: 1.03 mg/dL (ref 0.40–1.50)
GFR: 79.23 mL/min (ref >60.00)
Glucose, Bld: 92 mg/dL (ref 70–99)
Potassium: 4 mEq/L (ref 3.5–5.1)
Sodium: 139 mEq/L (ref 135–145)

## 2022-07-25 LAB — CBC WITH DIFFERENTIAL/PLATELET
Basophils Absolute: 0.1 10*3/uL (ref 0.0–0.1)
Basophils Relative: 1 % (ref 0.0–3.0)
Eosinophils Absolute: 0.3 10*3/uL (ref 0.0–0.7)
Eosinophils Relative: 5.4 % — ABNORMAL HIGH (ref 0.0–5.0)
HCT: 44 % (ref 39.0–52.0)
Hemoglobin: 15.2 g/dL (ref 13.0–17.0)
Lymphocytes Relative: 17.1 % (ref 12.0–46.0)
Lymphs Abs: 1 10*3/uL (ref 0.7–4.0)
MCHC: 34.5 g/dL (ref 30.0–36.0)
MCV: 88.4 fl (ref 78.0–100.0)
Monocytes Absolute: 0.4 10*3/uL (ref 0.1–1.0)
Monocytes Relative: 6 % (ref 3.0–12.0)
Neutro Abs: 4.2 10*3/uL (ref 1.4–7.7)
Neutrophils Relative %: 70.5 % (ref 43.0–77.0)
Platelets: 233 10*3/uL (ref 150.0–400.0)
RBC: 4.98 Mil/uL (ref 4.22–5.81)
RDW: 13.5 % (ref 11.5–15.5)
WBC: 6 10*3/uL (ref 4.0–10.5)

## 2022-07-25 LAB — VITAMIN D 25 HYDROXY (VIT D DEFICIENCY, FRACTURES): VITD: 55.94 ng/mL (ref 30.00–100.00)

## 2022-07-25 LAB — VITAMIN B12: Vitamin B-12: >1500 pg/mL — ABNORMAL HIGH (ref 211–911)

## 2022-07-25 LAB — HEMOGLOBIN A1C: Hgb A1c MFr Bld: 5.3 % (ref 4.6–6.5)

## 2022-07-25 LAB — TSH: TSH: 1.19 u[IU]/mL (ref 0.35–5.50)

## 2022-07-27 ENCOUNTER — Other Ambulatory Visit: Payer: Medicare Other

## 2022-07-28 ENCOUNTER — Other Ambulatory Visit: Payer: Self-pay | Admitting: Family Medicine

## 2022-08-01 ENCOUNTER — Other Ambulatory Visit: Payer: Self-pay | Admitting: Family Medicine

## 2022-08-02 NOTE — Telephone Encounter (Signed)
Pt called to follow up on his refill request for the  ALPRAZolam Duanne Moron) 0.25 MG tablet  [id number] - [address] Phone:  [phone]  Fax:  [phone]     LOV:  07/24/22  Pt states he has been waiting several days for a call back and he is almost out.

## 2022-08-03 MED ORDER — ALPRAZOLAM 0.25 MG PO TABS: 0.2500 mg | ORAL_TABLET | Freq: Three times a day (TID) | ORAL | 5 refills | Status: DC | PRN

## 2022-08-03 NOTE — Telephone Encounter (Signed)
Done

## 2022-08-03 NOTE — Telephone Encounter (Signed)
Last OV-07/24/22 Last refill- 01/09/22--90 tabs, 5 refills   No future OV scheduled.

## 2022-08-03 NOTE — Addendum Note (Signed)
Addended by: Alysia Penna A on: 08/03/2022 12:44 PM   Modules accepted: Orders

## 2022-08-07 ENCOUNTER — Ambulatory Visit
Admission: RE | Admit: 2022-08-07 | Discharge: 2022-08-07 | Disposition: A | Discharge status: Home / Self Care | Payer: Medicare Other | Source: Ambulatory Visit | Attending: Family Medicine | Admitting: Family Medicine

## 2022-08-07 DIAGNOSIS — Z8249 Family history of ischemic heart disease and other diseases of the circulatory system: Secondary | ICD-10-CM | POA: Insufficient documentation

## 2022-08-08 ENCOUNTER — Telehealth: Payer: Self-pay | Admitting: Family Medicine

## 2022-08-08 NOTE — Telephone Encounter (Signed)
Error/njr °

## 2022-08-11 ENCOUNTER — Ambulatory Visit (INDEPENDENT_AMBULATORY_CARE_PROVIDER_SITE_OTHER): Payer: Medicare Other | Admitting: Pulmonary Disease

## 2022-08-11 ENCOUNTER — Encounter: Payer: Self-pay | Admitting: Pulmonary Disease

## 2022-08-11 VITALS — BP 124/82 | HR 71 | Temp 98.0°F | Ht 76.0 in | Wt 223.0 lb

## 2022-08-11 DIAGNOSIS — R0683 Snoring: Secondary | ICD-10-CM | POA: Diagnosis not present

## 2022-08-11 MED ORDER — ESZOPICLONE 2 MG PO TABS: 2.0000 mg | ORAL_TABLET | Freq: Every evening | ORAL | 3 refills | Status: DC | PRN

## 2022-08-11 NOTE — Progress Notes (Signed)
Bernard James    [id number]    07/25/1961  Primary Care Physician:Fry, Ishmael Holter, MD  Referring Physician: Laurey Morale, MD [address] [place],  [place] [address]  Chief complaint:   Patient being seen for nonrestorative sleep Fatigue  HPI:  Patient with nonrestorative sleep abdomen going on for over a year now Ambien was not well-tolerated  Sometimes he does get good enough sleep but does not feel rested Feels as tired as when he went to sleep Sometimes does not get good enough rest  Melatonin does help him fall asleep but does not keep him asleep  Currently does use Xanax for anxiety which sometimes helps with his sleep  History of snoring Denies witnessed apneas He does have some dryness of his mouth in the mornings No morning headaches, whenever he has headaches he feels is related to allergies No night sweats  Never smoker  No pets  He does some woodworking but does wear a mask regularly  He continues to have significant tiredness and fatigue during the day  He did have a sleep study in the past, about 20 years ago, was told he may have have some sleep apnea then but it was very mild that he was not treated  Outpatient Encounter Medications as of 08/11/2022  Medication Sig   albuterol (PROVENTIL) (2.5 MG/3ML) 0.083% nebulizer solution Take 3 mLs (2.5 mg total) by nebulization every 6 (six) hours as needed for wheezing or shortness of breath.   ALPRAZolam (XANAX) 0.25 MG tablet Take 1 tablet (0.25 mg total) by mouth 3 (three) times daily as needed for anxiety.   aspirin 81 MG tablet Take 81 mg by mouth daily.     clindamycin (CLINDAGEL) 1 % gel APPLY TO AFFECTED AREA TWICE A DAY   desoximetasone (TOPICORT) 0.25 % cream Apply 1 Application topically 2 (two) times daily.   eszopiclone (LUNESTA) 2 MG TABS tablet Take 1 tablet (2 mg total) by mouth at bedtime as needed for sleep. Take immediately before bedtime   famotidine (PEPCID) 20 MG  tablet Take 20 mg by mouth 2 (two) times daily. take half a tablet of the 20 MG twice daily   fluticasone (FLONASE) 50 MCG/ACT nasal spray SHAKE LIQUID AND USE 2 SPRAYS IN EACH NOSTRIL DAILY AS NEEDED   mupirocin cream (BACTROBAN) 2 % Apply topically 3 (three) times daily.   sertraline (ZOLOFT) 25 MG tablet Take 1 tablet (25 mg total) by mouth daily.   Testosterone (ANDROGEL PUMP) 20.25 MG/ACT (1.62%) GEL Place 4 Squirts onto the skin daily.   tretinoin (RETIN-A) 0.1 % cream APPLY EXTERNALLY TO THE AFFECTED AREA AT BEDTIME   [DISCONTINUED] zolpidem (AMBIEN) 10 MG tablet Take 1 tablet (10 mg total) by mouth at bedtime as needed for sleep. (Patient not taking: Reported on 07/24/2022)   No facility-administered encounter medications on file as of 08/11/2022.    Allergies as of 08/11/2022 - Review Complete 08/11/2022  Allergen Reaction Noted   Flagyl [metronidazole] Other (See Comments) 08/25/2015   Doxycycline hyclate Nausea Only 12/20/2007    Past Medical History:  Diagnosis Date   Allergy    allergic rhinitis   Anxiety    Depression    GERD (gastroesophageal reflux disease)    Hyperlipidemia    Stroke ([place])    in cerebellum   Tension headache     Past Surgical History:  Procedure Laterality Date   HERNIA REPAIR     bilateral, inguinal  LIGAMENT REPAIR     repair of torn ligament in left ankle   TONSILLECTOMY      Family History  Problem Relation Age of Onset   Atrial fibrillation Father    Hypertension Other    Arthritis Other    Fibromyalgia Other    Cancer Other        lymphoma   Sjogren's syndrome Sister    Colon cancer Neg Hx     Social History   Socioeconomic History   Marital status: Single    Spouse name: Not on file   Number of children: Not on file   Years of education: Not on file   Highest education level: Not on file  Occupational History   Occupation: disabled  Tobacco Use   Smoking status: Never   Smokeless tobacco: Never  Substance and  Sexual Activity   Alcohol use: No    Alcohol/week: 0.0 standard drinks of alcohol   Drug use: No   Sexual activity: Not on file  Other Topics Concern   Not on file  Social History Narrative   Not on file   Social Determinants of Health   Financial Resource Strain: Not on file  Food Insecurity: Not on file  Transportation Needs: Not on file  Physical Activity: Not on file  Stress: Not on file  Social Connections: Not on file  Intimate Partner Violence: Not on file    Review of Systems  Constitutional:  Positive for fatigue.  Psychiatric/Behavioral:  Positive for sleep disturbance.     Vitals:   08/11/22 1437  BP: 124/82  Pulse: 71  Temp: 98 F (36.7 C)     Physical Exam Constitutional:      Appearance: Normal appearance.  HENT:     Head: Normocephalic.     Mouth/Throat:     Mouth: Mucous membranes are moist.     Comments: Mallampati 3, crowded oropharynx, macroglossia Cardiovascular:     Rate and Rhythm: Normal rate and regular rhythm.     Heart sounds: No murmur heard.    No friction rub.  Pulmonary:     Effort: No respiratory distress.     Breath sounds: No stridor. No wheezing or rhonchi.  Musculoskeletal:     Cervical back: No rigidity or tenderness.  Neurological:     Mental Status: He is alert.  Psychiatric:        Mood and Affect: Mood normal.       08/11/2022    2:00 PM  Results of the Epworth flowsheet  Sitting and reading 0  Watching TV 0  Sitting, inactive in a public place (e.g. a theatre or a meeting) 0  As a passenger in a car for an hour without a break 0  Lying down to rest in the afternoon when circumstances permit 0  Sitting and talking to someone 0  Sitting quietly after a lunch without alcohol 0  In a car, while stopped for a few minutes in traffic 0  Total score 0   Data Reviewed: Previous sleep study was a long time ago, results not available  Assessment:  Nonrestorative sleep  Daytime fatigue  History of  snoring  Pathophysiology of sleep disordered breathing discussed with the patient Treatment options discussed with the patient  Sleep onset and sleep maintenance insomnia  Plan/Recommendations: Patient will benefit from an in lab sleep study to ascertain if there is significant sleep disordered breathing present  Home sleep study would not be appropriate in this situation as it  may be falsely negative  Graded exercise as tolerated  Prescription for Lunesta sent to pharmacy for insomnia  I will see him back in about 3 to 4 months  Encouraged to call with significant concerns   Sherrilyn Rist MD [facility] 08/11/2022, 3:11 PM  CC: Laurey Morale, MD

## 2022-08-11 NOTE — Patient Instructions (Signed)
Schedule you for an in lab split-night study  Graded exercise as tolerated  Lateral sleep will help number of events and snoring  I will see you in about 3 to 4 months  Living With Sleep Apnea Sleep apnea is a condition in which breathing pauses or becomes shallow during sleep. Sleep apnea is most commonly caused by a collapsed or blocked airway. People with sleep apnea usually snore loudly. They may have times when they gasp and stop breathing for 10 seconds or more during sleep. This may happen many times during the night. The breaks in breathing also interrupt the deep sleep that you need to feel rested. Even if you do not completely wake up from the gaps in breathing, your sleep may not be restful and you feel tired during the day. You may also have a headache in the morning and low energy during the day, and you may feel anxious or depressed. How can sleep apnea affect me? Sleep apnea increases your chances of extreme tiredness during the day (daytime fatigue). It can also increase your risk for health conditions, such as: Heart attack. Stroke. Obesity. Type 2 diabetes. Heart failure. Irregular heartbeat. High blood pressure. If you have daytime fatigue as a result of sleep apnea, you may be more likely to: Perform poorly at school or work. Fall asleep while driving. Have difficulty with attention. Develop depression or anxiety. Have sexual dysfunction. What actions can I take to manage sleep apnea? Sleep apnea treatment  If you were given a device to open your airway while you sleep, use it only as told by your health care provider. You may be given: An oral appliance. This is a custom-made mouthpiece that shifts your lower jaw forward. A continuous positive airway pressure (CPAP) device. This device blows air through a mask when you breathe out (exhale). A nasal expiratory positive airway pressure (EPAP) device. This device has valves that you put into each nostril. A bi-level  positive airway pressure (BIPAP) device. This device blows air through a mask when you breathe in (inhale) and breathe out (exhale). You may need surgery if other treatments do not work for you. Sleep habits Go to sleep and wake up at the same time every day. This helps set your internal clock (circadian rhythm) for sleeping. If you stay up later than usual, such as on weekends, try to get up in the morning within 2 hours of your normal wake time. Try to get at least 7-9 hours of sleep each night. Stop using a computer, tablet, and mobile phone a few hours before bedtime. Do not take long naps during the day. If you nap, limit it to 30 minutes. Have a relaxing bedtime routine. Reading or listening to music may relax you and help you sleep. Use your bedroom only for sleep. Keep your television and computer out of your bedroom. Keep your bedroom cool, dark, and quiet. Use a supportive mattress and pillows. Follow your health care provider's instructions for other changes to sleep habits. Nutrition Do not eat heavy meals in the evening. Do not have caffeine in the later part of the day. The effects of caffeine can last for more than 5 hours. Follow your health care provider's or dietitian's instructions for any diet changes. Lifestyle     Do not drink alcohol before bedtime. Alcohol can cause you to fall asleep at first, but then it can cause you to wake up in the middle of the night and have trouble getting back to  sleep. Do not use any products that contain nicotine or tobacco. These products include cigarettes, chewing tobacco, and vaping devices, such as e-cigarettes. If you need help quitting, ask your health care provider. Medicines Take over-the-counter and prescription medicines only as told by your health care provider. Do not use over-the-counter sleep medicine. You can become dependent on this medicine, and it can make sleep apnea worse. Do not use medicines, such as sedatives and  narcotics, unless told by your health care provider. Activity Exercise on most days, but avoid exercising in the evening. Exercising near bedtime can interfere with sleeping. If possible, spend time outside every day. Natural light helps regulate your circadian rhythm. General information Lose weight if you need to, and maintain a healthy weight. Keep all follow-up visits. This is important. If you are having surgery, make sure to tell your health care provider that you have sleep apnea. You may need to bring your device with you. Where to find more information Learn more about sleep apnea and daytime fatigue from: American Sleep Association: sleepassociation.[place]: sleepfoundation.org National Heart, Lung, and Blood Institute: https://www.hartman-hill.biz/ Summary Sleep apnea is a condition in which breathing pauses or becomes shallow during sleep. Sleep apnea can cause daytime fatigue and other serious health conditions. You may need to wear a device while sleeping to help keep your airway open. If you are having surgery, make sure to tell your health care provider that you have sleep apnea. You may need to bring your device with you. Making changes to sleep habits, diet, lifestyle, and activity can help you manage sleep apnea. This information is not intended to replace advice given to you by your health care provider. Make sure you discuss any questions you have with your health care provider. Document Revised: 05/04/2021 Document Reviewed: 09/03/2020 Elsevier Patient Education  [place].

## 2022-08-21 ENCOUNTER — Telehealth: Payer: Self-pay | Admitting: Pulmonary Disease

## 2022-08-21 ENCOUNTER — Ambulatory Visit: Payer: Medicare Other | Attending: Otolaryngology

## 2022-08-21 DIAGNOSIS — G4733 Obstructive sleep apnea (adult) (pediatric): Secondary | ICD-10-CM | POA: Insufficient documentation

## 2022-08-21 DIAGNOSIS — G4700 Insomnia, unspecified: Secondary | ICD-10-CM | POA: Diagnosis not present

## 2022-08-21 DIAGNOSIS — R0683 Snoring: Secondary | ICD-10-CM | POA: Insufficient documentation

## 2022-08-21 DIAGNOSIS — G4730 Sleep apnea, unspecified: Secondary | ICD-10-CM | POA: Diagnosis not present

## 2022-08-22 ENCOUNTER — Other Ambulatory Visit (HOSPITAL_COMMUNITY): Payer: Self-pay

## 2022-08-22 ENCOUNTER — Telehealth: Payer: Self-pay

## 2022-08-22 NOTE — Telephone Encounter (Signed)
Called and spoke with pt who states the pharmacy told him that a PA needed to be done on his lunesta. Routing to PA team.

## 2022-08-22 NOTE — Telephone Encounter (Signed)
PA request received through patient.  PA request input through [facility] to [place].  PA has been submitted and is awaiting determination.   Key: [id number]

## 2022-08-23 NOTE — Telephone Encounter (Signed)
PA has been APPROVED from 08/22/2022-08/23/2023 for Eszopiclone '2mg'$  tablets.

## 2022-08-29 ENCOUNTER — Telehealth: Payer: Self-pay | Admitting: Pulmonary Disease

## 2022-08-29 NOTE — Telephone Encounter (Signed)
Called patient and he states he has some concerns in regards to sleep study. He states that when he did the sleep study he did not sleep at all. He is wanting to know if he can redo it. He is not sure if the sleep study showed anything. Pt did a split night at the hospital.    I did also update him the his Bernard James the PA was approved on 08/22/22. And that he should call his pharmacy to see if it is ready.   Please advise sir

## 2022-08-31 ENCOUNTER — Other Ambulatory Visit: Payer: Self-pay | Admitting: Family Medicine

## 2022-08-31 DIAGNOSIS — J3089 Other allergic rhinitis: Secondary | ICD-10-CM

## 2022-09-01 NOTE — Telephone Encounter (Signed)
See encounter from 11/14. Lunesta PA was approved and patient was made aware.

## 2022-09-05 ENCOUNTER — Other Ambulatory Visit: Payer: Self-pay | Admitting: Family Medicine

## 2022-09-05 DIAGNOSIS — J3089 Other allergic rhinitis: Secondary | ICD-10-CM

## 2022-09-07 NOTE — Telephone Encounter (Signed)
Still waiting for response from Dr Ander Slade  Pt aware   Please advise on April's question in the documentation below

## 2022-09-11 NOTE — Telephone Encounter (Signed)
Patient's sister calling back for sleep study results. Sister mentioned maybe doing a day study. Please call patient at [phone] or [phone].

## 2022-09-11 NOTE — Telephone Encounter (Signed)
Per Dr Ander Slade- who was the study assigned to? Results were not sent to him. I called sleep center and had to [facility] for [facility]. I called sister and left detailed msg that we are working on the results.

## 2022-09-14 NOTE — Telephone Encounter (Signed)
I have sent a request to the other physicians who read studies to try and locate and get the study result.   We will update him as soon as resulted

## 2022-09-14 NOTE — Telephone Encounter (Signed)
I called sleep center again and had to leave msg  Pt is frustrated that his results have not been given to him  He states that he only sleeps about 3 hours per night  He wants results today  Dr Ander Slade, please advise asap thanks

## 2022-09-15 NOTE — Telephone Encounter (Signed)
Please update patient  Apologize for the confusion about resolving your study   Not an excuse for not having your results as promptly as expected  There was a change in the sleep study system at [facility] during the period you had your study, there was also a change in the physician who is assigned the study to read. I have involved as many people as possible to try and track down the results of the study.  I, unfortunately do not read the studies performed at [facility] but also expect that the results will be uploaded to the system as soon as resulted.  Will update you as soon as resolved

## 2022-09-15 NOTE — Telephone Encounter (Signed)
I called the pt to give update and there was no answer- LMTCB.

## 2022-09-15 NOTE — Telephone Encounter (Signed)
Called and spoke with pt letting him know the info from Dr. Jenetta Downer and he verbalized understanding. I stated to pt that we would reach out to him as soon as we had the results of his sleep study and he verbalized understanding.  Routing back to Dr. Jenetta Downer so he has this to keep an eye on when the results of pt's study is put in system.

## 2022-09-18 NOTE — Telephone Encounter (Signed)
Sleep Services will be sending sleep test results. Phone number is [phone]. Patient phone number is [phone].

## 2022-09-21 ENCOUNTER — Telehealth: Payer: Self-pay | Admitting: Pulmonary Disease

## 2022-09-21 NOTE — Telephone Encounter (Signed)
Patient is returning phone call. Patient phone number is [phone].

## 2022-09-21 NOTE — Telephone Encounter (Signed)
Did call patient to update him with his sleep study results   Study was limited by insomnia for most of the night  Did reveal events with an AHI of 3.2-not enough to need any treatment for- understandably, this is caused by the limited amount of sleep.  Repeating sleep study will be most appropriate if there is ongoing concerns for sleep apnea after adequately treating insomnia

## 2022-09-21 NOTE — Telephone Encounter (Signed)
Dr Jenetta Downer called and updated patient with sleep study results. Nothing further needed

## 2022-09-21 NOTE — Telephone Encounter (Signed)
I checked Dr Judson Roch mail and have still not received the sleep study Called the sleep center at [phone]

## 2022-09-21 NOTE — Telephone Encounter (Signed)
See phone note from 12/14.

## 2022-09-21 NOTE — Telephone Encounter (Signed)
Sleep study received via fax for Dr. Ander Slade.  Sleep study scanned to Dr. [facility] email to read.  Message routed to Dr. Ander Slade to advise on results

## 2022-10-05 ENCOUNTER — Telehealth: Payer: Self-pay | Admitting: Pulmonary Disease

## 2022-10-05 DIAGNOSIS — R0683 Snoring: Secondary | ICD-10-CM

## 2022-10-05 NOTE — Telephone Encounter (Signed)
Called patient and he states that he is wanting to figure out what is going on. Patient states that he is wanting to redo the sleep study and see if he can do it on a 3rd shift schedule. He states he only slept 1-2 hours on the last sleep study.   Sir is it possible to redo the sleep study but have it set for different time setting?  He states he has been on 3rd shift schedule for 20-30 years.   Please advise sir

## 2022-10-05 NOTE — Telephone Encounter (Signed)
Patient called upset because he received a voicemail with sleep study results 2 weeks ago but when he called back no one was able to help him and no call back. Patient needs to know if he needs another sleep study or if he needs a CPAP machine. Patient is constantly tired and needs help. Please call with results and go over next steps with patient.

## 2022-10-06 NOTE — Telephone Encounter (Signed)
Dr. Ander Slade please advise on if sleep study can ordered at different times.

## 2022-10-11 NOTE — Telephone Encounter (Signed)
I tried to call [facility] sleep lab - [phone] - phone rings and then goes busy.  I have called Bernard James and left her a message to see if she has another contact #.

## 2022-10-11 NOTE — Telephone Encounter (Signed)
Sleep study can be scheduled for different times, we have to confirm with the sleep lab that it can be staffed  -Depends on where he wants to have the study done  -Once we are sure where he wants to have the study done, we can call the sleep lab to make sure he can be accommodated  -I do not have control over what the sleep lab can accommodate.  We so far do not have a study that shows that he has significant sleep apnea that will benefit from treatment Tiredness, sleepiness can come from not getting enough hours of sleep

## 2022-10-11 NOTE — Telephone Encounter (Signed)
Bernard James gave me phone # [phone] - I called and had to leave a vm for sleep lab to call me back.

## 2022-10-11 NOTE — Telephone Encounter (Signed)
Hey ladies,  So I called patient and he is wanting to move forward with another sleep study. But he is needing it for an off time. He normally works 3rd shift and has been on this schedule for 20 plus years.   Can we call the sleep lad to see if they can do this possibly for this patient.  Dr Jenetta Downer states that he is ok to try to redo the sleep study. But I told the patient I would have to have the PCCs check with staffing at sleep lab.  Thank you ladies.

## 2022-10-13 NOTE — Telephone Encounter (Signed)
Spoke to [employer] at [phone] and she states that is not # for [employer] - she states # is [phone].  Called that # and reached [facility] - had to leave a vm for someone to call me back.

## 2022-10-13 NOTE — Telephone Encounter (Signed)
Jenny Reichmann called me back from [employer] - she initially stated new order would be needed to do new study but then said she would need to verify if the [facility] location could do a daytime study.  She is having to check & will call me back.

## 2022-10-16 NOTE — Telephone Encounter (Signed)
Bernard James just returned my call & states she has not heard back as to whether daytime studies are done in [place].  She states if she does not hear back by tomorrow she will call them again.

## 2022-10-16 NOTE — Telephone Encounter (Signed)
Have not had a returned call from [facility] to see if daytime study can be done.  Called back & left a vm for a returned call.

## 2022-10-16 NOTE — Telephone Encounter (Signed)
Contacted patient to inform we are working on getting him a study scheduled in the daytime. Patient verbalized understanding and appreciated the follow up.

## 2022-10-17 NOTE — Telephone Encounter (Signed)
Updated order has been placed.

## 2022-10-17 NOTE — Telephone Encounter (Signed)
Bernard James just called me back form [facility].  She states they can do daytime studies on certain dates.  She states a new order will need to be sent.  Will route back to triage for new order to be placed and will need a note on it to state pt wants daytime study to be done at [facility] sleep lab.

## 2022-10-20 NOTE — Telephone Encounter (Signed)
Left detailed voicemail (per DPR) to inform patient that new order was sent to do study in the daytime and left the labs number for patient to contact if he has not heard from them soon.

## 2022-11-02 ENCOUNTER — Other Ambulatory Visit: Payer: Self-pay | Admitting: Family Medicine

## 2022-11-02 DIAGNOSIS — J3089 Other allergic rhinitis: Secondary | ICD-10-CM

## 2022-11-13 ENCOUNTER — Encounter: Payer: Self-pay | Admitting: Family Medicine

## 2022-11-13 ENCOUNTER — Ambulatory Visit (INDEPENDENT_AMBULATORY_CARE_PROVIDER_SITE_OTHER): Payer: Medicare Other | Admitting: Family Medicine

## 2022-11-13 VITALS — BP 126/80 | HR 70 | Temp 97.0°F | Wt 228.0 lb

## 2022-11-13 DIAGNOSIS — R5383 Other fatigue: Secondary | ICD-10-CM

## 2022-11-13 DIAGNOSIS — E291 Testicular hypofunction: Secondary | ICD-10-CM

## 2022-11-13 DIAGNOSIS — E039 Hypothyroidism, unspecified: Secondary | ICD-10-CM | POA: Diagnosis not present

## 2022-11-13 DIAGNOSIS — R7989 Other specified abnormal findings of blood chemistry: Secondary | ICD-10-CM | POA: Diagnosis not present

## 2022-11-13 NOTE — Progress Notes (Signed)
   Subjective:    Patient ID: Bernard James, male    DOB: 1960-11-10, 62 y.o.   MRN: [id number]  HPI Here to follow up on generalized fatigue. We saw him for this last October, and we drew a number of labs to evaluate this. These were all normal. He had a sleep study on 08-21-22, but he only slept for 90 minutes, so the data was not meaningful. He is scheduled for another sleep study on 12-08-22. He has gained about 18 lbs in the past 2 years.    Review of Systems  Constitutional:  Positive for fatigue.  Respiratory: Negative.    Cardiovascular: Negative.        Objective:   Physical Exam Constitutional:      Appearance: Normal appearance.  Cardiovascular:     Rate and Rhythm: Normal rate and regular rhythm.     Pulses: Normal pulses.     Heart sounds: Normal heart sounds.  Pulmonary:     Effort: Pulmonary effort is normal.     Breath sounds: Normal breath sounds.  Neurological:     General: No focal deficit present.     Mental Status: He is alert and oriented to person, place, and time.           Assessment & Plan:  Chronic fatigue. I still feel that sleep apnea plays a large part in this, so we will watch for the results of the upcoming study. We wil also check a full thyroid panel and a testosterone level today.  Alysia Penna, MD

## 2022-11-14 LAB — TSH: TSH: 1.48 u[IU]/mL (ref 0.35–5.50)

## 2022-11-14 LAB — T3, FREE: T3, Free: 3.3 pg/mL (ref 2.3–4.2)

## 2022-11-14 LAB — T4, FREE: Free T4: 0.78 ng/dL (ref 0.60–1.60)

## 2022-11-14 LAB — TESTOSTERONE: Testosterone: 263.82 ng/dL — ABNORMAL LOW (ref 300.00–890.00)

## 2022-11-19 ENCOUNTER — Encounter: Payer: Self-pay | Admitting: Family Medicine

## 2022-11-20 MED ORDER — TESTOSTERONE 20.25 MG/ACT (1.62%) TD GEL: 4.0000 | Freq: Every day | TRANSDERMAL | 5 refills | Status: DC

## 2022-11-20 NOTE — Telephone Encounter (Signed)
I refilled the gel

## 2022-11-27 NOTE — Telephone Encounter (Signed)
Pt call back and stated pharmacy said he need a PA for the Testosterone Gel and he stated he need a call back.

## 2022-11-28 NOTE — Telephone Encounter (Signed)
PA for Testosterone was sent to your plan. Bernard James (Key: [id number])  Your information has been submitted to [place]. [employer] [place] will review the request and notify you of the determination decision directly, typically within 3 business days of your submission and once all necessary information is received.  You will also receive your request decision electronically. To check for an update later, open the request again from your dashboard.  If [employer] [place] has not responded within the specified timeframe or if you have any questions about your PA submission, contact [employer] directly at [facility]) [phone] or ([place]) [phone].

## 2022-12-07 DIAGNOSIS — R0683 Snoring: Secondary | ICD-10-CM | POA: Diagnosis not present

## 2022-12-08 ENCOUNTER — Ambulatory Visit: Payer: Medicare Other | Attending: Otolaryngology

## 2022-12-08 DIAGNOSIS — G4700 Insomnia, unspecified: Secondary | ICD-10-CM | POA: Insufficient documentation

## 2022-12-08 DIAGNOSIS — G4733 Obstructive sleep apnea (adult) (pediatric): Secondary | ICD-10-CM | POA: Insufficient documentation

## 2022-12-08 DIAGNOSIS — R0683 Snoring: Secondary | ICD-10-CM | POA: Insufficient documentation

## 2022-12-08 DIAGNOSIS — G4761 Periodic limb movement disorder: Secondary | ICD-10-CM | POA: Diagnosis not present

## 2022-12-08 DIAGNOSIS — R5383 Other fatigue: Secondary | ICD-10-CM | POA: Diagnosis not present

## 2022-12-11 NOTE — Telephone Encounter (Signed)
Bernard James (Key: [id number])   This request has not been approved. [employer] [place] will send a letter to the member and you regarding this decision and the right to appeal.

## 2022-12-15 NOTE — Telephone Encounter (Signed)
Noted  

## 2022-12-15 NOTE — Telephone Encounter (Signed)
Pt called to say he spoke to [employer] company again, and this Rx was approved.  Pt is asking if we received the same information yet?  Pt is asking that a new Rx be sent to  [place] Phone: [phone]  Fax: [phone]

## 2022-12-19 ENCOUNTER — Encounter: Payer: Self-pay | Admitting: Pulmonary Disease

## 2022-12-19 ENCOUNTER — Telehealth (INDEPENDENT_AMBULATORY_CARE_PROVIDER_SITE_OTHER): Payer: Medicare Other | Admitting: Pulmonary Disease

## 2022-12-19 DIAGNOSIS — G4733 Obstructive sleep apnea (adult) (pediatric): Secondary | ICD-10-CM

## 2022-12-19 NOTE — Telephone Encounter (Signed)
NPSG in the daytime showed only mild OSA, RDI 10/hour. Several.'s of REM sleep were noted with REM latency of 66 minutes.  Please correlate clinically

## 2022-12-20 NOTE — Telephone Encounter (Signed)
Received the following message from patient:   "Hello, I'm wondering if your office had received the results of my most recent Sleep Study done on December 08, 2022?   The last study took over a month to get the results back to your office. Thank you, Bernard James."  There is a phone in his chart from Dr. Elsworth Soho on 12/19/22 for results. However, the patient was listed as a no show for his sleep study on 12/08/22. Dr. Ander Slade, can you please advise? Thanks!

## 2022-12-27 NOTE — Telephone Encounter (Signed)
Got a message from Dr. Elsworth Soho  Sleep study shows mild obstructive sleep apnea  Treatment will depend on if patient has significant symptoms of daytime sleepiness, mood disorder, difficult to control hypertension, cardiac dysrhythmia  Options of treatment for mild obstructive sleep apnea will include  1.  CPAP therapy if there is significant daytime sleepiness or other comorbidities including history of CVA or cardiac disease -If CPAP is chosen as an option of treatment auto titrating CPAP with a pressure setting of 5-15 will be appropriate . 2.  Watchful waiting with emphasis on weight loss measures, sleep position modification to optimize lateral sleep, elevating the head of the bed by about 30 degrees may also help.  3.  An oral device may be fashioned for the treatment of mild sleep disordered breathing, will involve referral to dentist.  Follow-up as previously scheduled

## 2022-12-29 NOTE — Telephone Encounter (Signed)
CPAP recommended for mild sleep apnea with significant daytime symptoms

## 2022-12-29 NOTE — Telephone Encounter (Signed)
Dr. Jenetta Downer please advise on what settings

## 2023-01-02 ENCOUNTER — Other Ambulatory Visit: Payer: Self-pay | Admitting: Family Medicine

## 2023-01-02 DIAGNOSIS — J3089 Other allergic rhinitis: Secondary | ICD-10-CM

## 2023-01-04 NOTE — Telephone Encounter (Signed)
DME referral for treatment of mild obstructive sleep apnea  Auto CPAP 5-15 with heated humidification, patient's mask of choice

## 2023-01-04 NOTE — Telephone Encounter (Signed)
See mychart message from 3/12.

## 2023-02-07 ENCOUNTER — Telehealth: Payer: Self-pay | Admitting: Pulmonary Disease

## 2023-02-07 NOTE — Telephone Encounter (Signed)
Dr. Val Eagle please advise. See message below from patient   I need a prescription change please to lower the pressure. I cannot sleep with this much pressure in it. My ears are getting too much pressure in them-I guess through my Eustachian tubes.

## 2023-02-07 NOTE — Telephone Encounter (Signed)
Ordered change to 5-10 pressure

## 2023-02-07 NOTE — Telephone Encounter (Signed)
Order CPAP pressure changed from 5-15 to 5-10  Will need a download from the machine in about 2 to 4 weeks to assess that it is still working well

## 2023-02-07 NOTE — Telephone Encounter (Signed)
Pt calling in to get pressure lowered on his CPAP machine

## 2023-02-08 ENCOUNTER — Other Ambulatory Visit: Payer: Self-pay | Admitting: Family Medicine

## 2023-02-08 NOTE — Telephone Encounter (Signed)
Pressure change order has been placed. Nothing further needed at this time

## 2023-02-11 DIAGNOSIS — S8392XA Sprain of unspecified site of left knee, initial encounter: Secondary | ICD-10-CM | POA: Diagnosis not present

## 2023-02-12 NOTE — Telephone Encounter (Signed)
Closing this encounter since my chart message has been sent to patient

## 2023-02-13 NOTE — Telephone Encounter (Signed)
DME referral for CPAP change  Auto CPAP 5-10  CPAP pressure usually starts at 5  The machine will only give you enough pressure to open up your airway

## 2023-03-21 DIAGNOSIS — M25562 Pain in left knee: Secondary | ICD-10-CM | POA: Diagnosis not present

## 2023-03-30 DIAGNOSIS — H2513 Age-related nuclear cataract, bilateral: Secondary | ICD-10-CM | POA: Diagnosis not present

## 2023-03-30 DIAGNOSIS — H04123 Dry eye syndrome of bilateral lacrimal glands: Secondary | ICD-10-CM | POA: Diagnosis not present

## 2023-03-30 DIAGNOSIS — H5213 Myopia, bilateral: Secondary | ICD-10-CM | POA: Diagnosis not present

## 2023-03-30 DIAGNOSIS — H43811 Vitreous degeneration, right eye: Secondary | ICD-10-CM | POA: Diagnosis not present

## 2023-04-08 ENCOUNTER — Other Ambulatory Visit: Payer: Self-pay | Admitting: Family Medicine

## 2023-04-08 DIAGNOSIS — J3089 Other allergic rhinitis: Secondary | ICD-10-CM

## 2023-04-14 ENCOUNTER — Other Ambulatory Visit: Payer: Self-pay | Admitting: Family Medicine

## 2023-05-16 ENCOUNTER — Encounter: Payer: Self-pay | Admitting: Primary Care

## 2023-05-16 ENCOUNTER — Ambulatory Visit (INDEPENDENT_AMBULATORY_CARE_PROVIDER_SITE_OTHER): Payer: Medicare Other | Admitting: Primary Care

## 2023-05-16 VITALS — BP 118/78 | HR 68 | Temp 98.0°F | Ht 75.0 in | Wt 227.6 lb

## 2023-05-16 DIAGNOSIS — G4730 Sleep apnea, unspecified: Secondary | ICD-10-CM | POA: Diagnosis not present

## 2023-05-16 MED ORDER — ESZOPICLONE 1 MG PO TABS: 1.0000 mg | ORAL_TABLET | Freq: Every evening | ORAL | 0 refills | Status: DC | PRN

## 2023-05-16 NOTE — Progress Notes (Signed)
@Patient  ID: Bernard James, male    DOB: 11/27/1960, 62 y.o.   MRN: [id number]  Chief Complaint  Patient presents with   Follow-up    Trouble staying asleep with CPAP during night.    Referring provider: Nelwyn Salisbury, MD  HPI: 62 year old male, never smoked.  Past medical history significant for OSA.  Patient of Dr. Wynona Neat.   05/16/2023 Patient has mild OSA and was started on CPAP for excessive daytime sleepiness. He is compliant with CPAP use and feels it has helped some but is still very tired. He has a really difficult time sleeping, he is reluctant to take medication for insomnia. It can take several hours for him to fall asleep. He has tried sleep aids in the past but several have causes residual daytime grogginess. He was prescribed lunesta 2mg  during his last OV but did not try taking. He takes xanax as needed for anxiety.  Airview download 04/15/2023 - 05/14/2023 Usage days 30/30 days (100%); 28 days (93%) greater than 4 hours Average usage 7 hours 47 minutes Pressure 5 to 10 cm H2O (7.5 cm H2O-95%) Air leaks 25 L/min (95%) AHI 3.2  Allergies  Allergen Reactions   Flagyl [Metronidazole] Other (See Comments)    Dizziness    Doxycycline Hyclate Nausea Only    REACTION: stomach upset    Immunization History  Administered Date(s) Administered   Influenza Whole 07/11/2008, 07/08/2010   Influenza,inj,Quad PF,6+ Mos 08/21/2013, 06/23/2014, 08/03/2015, 07/18/2016, 08/07/2017, 08/16/2018   Td 08/13/2009    Past Medical History:  Diagnosis Date   Allergy    allergic rhinitis   Anxiety    Depression    GERD (gastroesophageal reflux disease)    Hyperlipidemia    Stroke (HCC)    in cerebellum   Tension headache     Tobacco History: Social History   Tobacco Use  Smoking Status Never  Smokeless Tobacco Never   Counseling given: Not Answered   Outpatient Medications Prior to Visit  Medication Sig Dispense Refill   albuterol (PROVENTIL) (2.5 MG/3ML) 0.083%  nebulizer solution Take 3 mLs (2.5 mg total) by nebulization every 6 (six) hours as needed for wheezing or shortness of breath. 75 mL 5   albuterol (VENTOLIN HFA) 108 (90 Base) MCG/ACT inhaler Inhale 2 puffs into the lungs every 6 (six) hours as needed for wheezing or shortness of breath.     ALPRAZolam (XANAX) 0.25 MG tablet TAKE 1 TABLET(0.25 MG) BY MOUTH THREE TIMES DAILY AS NEEDED FOR ANXIETY 90 tablet 5   aspirin 81 MG tablet Take 81 mg by mouth daily.       clindamycin (CLINDAGEL) 1 % gel APPLY TOPICALLY TO THE AFFECTED AREA TWICE DAILY 30 g 11   desoximetasone (TOPICORT) 0.25 % cream Apply 1 Application topically 2 (two) times daily. 30 g 5   famotidine (PEPCID) 20 MG tablet Take 20 mg by mouth 2 (two) times daily as needed for heartburn or indigestion. take half a tablet of the 20 MG twice daily     fluticasone (FLONASE) 50 MCG/ACT nasal spray SHAKE LIQUID AND USE 2 SPRAYS IN EACH NOSTRIL DAILY AS NEEDED 48 g 0   Testosterone (ANDROGEL PUMP) 20.25 MG/ACT (1.62%) GEL Place 4 Squirts onto the skin daily. 75 g 5   tretinoin (RETIN-A) 0.1 % cream APPLY EXTERNALLY TO THE AFFECTED AREA AT BEDTIME 20 g 11   mupirocin cream (BACTROBAN) 2 % Apply topically 3 (three) times daily. (Patient not taking: Reported on 05/16/2023) 15 g 11  sertraline (ZOLOFT) 25 MG tablet Take 1 tablet (25 mg total) by mouth daily. (Patient not taking: Reported on 11/13/2022) 30 tablet 5   eszopiclone (LUNESTA) 2 MG TABS tablet Take 1 tablet (2 mg total) by mouth at bedtime as needed for sleep. Take immediately before bedtime (Patient not taking: Reported on 05/16/2023) 30 tablet 3   No facility-administered medications prior to visit.   Review of Systems  Review of Systems  Constitutional:  Positive for fatigue.  HENT: Negative.    Respiratory: Negative.    Cardiovascular: Negative.    Physical Exam  BP 118/78 (BP Location: Left Arm, Patient Position: Sitting, Cuff Size: Large)   Pulse 68   Temp 98 F (36.7 C) (Oral)    Ht 6\' 3"  (1.905 m)   Wt 227 lb 9.6 oz (103.2 kg)   SpO2 98%   BMI 28.45 kg/m  Physical Exam Constitutional:      Appearance: Normal appearance.  HENT:     Head: Normocephalic and atraumatic.  Cardiovascular:     Rate and Rhythm: Normal rate.  Pulmonary:     Effort: Pulmonary effort is normal.  Skin:    General: Skin is warm and dry.  Neurological:     General: No focal deficit present.     Mental Status: He is alert and oriented to person, place, and time. Mental status is at baseline.  Psychiatric:        Mood and Affect: Mood normal.        Behavior: Behavior normal.        Thought Content: Thought content normal.        Judgment: Judgment normal.      Lab Results:  CBC    Component Value Date/Time   WBC 6.0 07/24/2022 1609   RBC 4.98 07/24/2022 1609   HGB 15.2 07/24/2022 1609   HGB 14.8 06/04/2014 1614   HCT 44.0 07/24/2022 1609   HCT 43.7 06/04/2014 1614   PLT 233.0 07/24/2022 1609   PLT 192 06/04/2014 1614   MCV 88.4 07/24/2022 1609   MCV 89 06/04/2014 1614   MCH 30.8 05/21/2020 1521   MCHC 34.5 07/24/2022 1609   RDW 13.5 07/24/2022 1609   RDW 13.2 06/04/2014 1614   LYMPHSABS 1.0 07/24/2022 1609   LYMPHSABS 0.8 (L) 06/04/2014 1614   MONOABS 0.4 07/24/2022 1609   MONOABS 0.3 06/04/2014 1614   EOSABS 0.3 07/24/2022 1609   EOSABS 0.1 06/04/2014 1614   BASOSABS 0.1 07/24/2022 1609   BASOSABS 0.1 06/04/2014 1614    BMET    Component Value Date/Time   NA 139 07/24/2022 1609   NA 141 06/04/2014 1614   K 4.0 07/24/2022 1609   K 3.8 06/04/2014 1614   CL 104 07/24/2022 1609   CL 106 06/04/2014 1614   CO2 27 07/24/2022 1609   CO2 26 06/04/2014 1614   GLUCOSE 92 07/24/2022 1609   GLUCOSE 98 06/04/2014 1614   BUN 11 07/24/2022 1609   BUN 13 06/04/2014 1614   CREATININE 1.03 07/24/2022 1609   CREATININE 1.11 05/21/2020 1521   CALCIUM 9.9 07/24/2022 1609   CALCIUM 9.1 06/04/2014 1614   GFRNONAA >60 08/24/2017 1410   GFRNONAA >60 06/04/2014 1614    GFRAA >60 08/24/2017 1410   GFRAA >60 06/04/2014 1614    BNP No results found for: "BNP"  ProBNP No results found for: "PROBNP"  Imaging: No results found.   Assessment & Plan:   Mild sleep apnea - Patient has mild OSA. Sleep study  was limited due to insomnia. He was started on CPAP due to daytime sleepiness/fatigue. He is 93% compliant with CPAP use > 4 hours last 30 days and feels it has helped some but he is still extremely tired. Current CPAP pressure 5-10cm h20; Residual AHI 3.2/hour. Advised patient continue to wear CPAP, aim to get 6-8 hours of usage per night.   Insomnia - Patient has a difficult time initiating sleep. He is compliant with CPAP use. He did not try Lunesta prescription. Several sleep aids in the past caused residual daytime grogginess. Recommend trying lower dose Lunesta 1mg  at bedtime. Advised patient maintain regular sleep schedule. Contact given for CBT for sleep training.   Glenford Bayley, NP 05/20/2023

## 2023-05-16 NOTE — Patient Instructions (Signed)
Continue to wear CPAP nightly 6 hours or more  Maintain regular sleep schedule   Recommend you reach out to [facility] for [facility] Phone- [phone]   Rx: Lunesta 1mg  at bedtime for insomnia  Follow-up: 3 months with either Beth Np or Dr. Wynona Neat   Insomnia Insomnia is a sleep disorder that makes it difficult to fall asleep or stay asleep. Insomnia can cause fatigue, low energy, difficulty concentrating, mood swings, and poor performance at work or school. There are three different ways to classify insomnia: Difficulty falling asleep. Difficulty staying asleep. Waking up too early in the morning. Any type of insomnia can be long-term (chronic) or short-term (acute). Both are common. Short-term insomnia usually lasts for 3 months or less. Chronic insomnia occurs at least three times a week for longer than 3 months. What are the causes? Insomnia may be caused by another condition, situation, or substance, such as: Having certain mental health conditions, such as anxiety and depression. Using caffeine, alcohol, tobacco, or drugs. Having gastrointestinal conditions, such as gastroesophageal reflux disease (GERD). Having certain medical conditions. These include: Asthma. Alzheimer's disease. Stroke. Chronic pain. An overactive thyroid gland (hyperthyroidism). Other sleep disorders, such as restless legs syndrome and sleep apnea. Menopause. Sometimes, the cause of insomnia may not be known. What increases the risk? Risk factors for insomnia include: Gender. Females are affected more often than males. Age. Insomnia is more common as people get older. Stress and certain medical and mental health conditions. Lack of exercise. Having an irregular work schedule. This may include working night shifts and traveling between different time zones. What are the signs or symptoms? If you have insomnia, the main symptom is having trouble falling asleep or having  trouble staying asleep. This may lead to other symptoms, such as: Feeling tired or having low energy. Feeling nervous about going to sleep. Not feeling rested in the morning. Having trouble concentrating. Feeling irritable, anxious, or depressed. How is this diagnosed? This condition may be diagnosed based on: Your symptoms and medical history. Your health care provider may ask about: Your sleep habits. Any medical conditions you have. Your mental health. A physical exam. How is this treated? Treatment for insomnia depends on the cause. Treatment may focus on treating an underlying condition that is causing the insomnia. Treatment may also include: Medicines to help you sleep. Counseling or therapy. Lifestyle adjustments to help you sleep better. Follow these instructions at home: Eating and drinking  Limit or avoid alcohol, caffeinated beverages, and products that contain nicotine and tobacco, especially close to bedtime. These can disrupt your sleep. Do not eat a large meal or eat spicy foods right before bedtime. This can lead to digestive discomfort that can make it hard for you to sleep. Sleep habits  Keep a sleep diary to help you and your health care provider figure out what could be causing your insomnia. Write down: When you sleep. When you wake up during the night. How well you sleep and how rested you feel the next day. Any side effects of medicines you are taking. What you eat and drink. Make your bedroom a dark, comfortable place where it is easy to fall asleep. Put up shades or blackout curtains to block light from outside. Use a white noise machine to block noise. Keep the temperature cool. Limit screen use before bedtime. This includes: Not watching TV. Not using your smartphone, tablet, or computer. Stick to a routine that includes going to bed and waking up at  the same times every day and night. This can help you fall asleep faster. Consider making a quiet  activity, such as reading, part of your nighttime routine. Try to avoid taking naps during the day so that you sleep better at night. Get out of bed if you are still awake after 15 minutes of trying to sleep. Keep the lights down, but try reading or doing a quiet activity. When you feel sleepy, go back to bed. General instructions Take over-the-counter and prescription medicines only as told by your health care provider. Exercise regularly as told by your health care provider. However, avoid exercising in the hours right before bedtime. Use relaxation techniques to manage stress. Ask your health care provider to suggest some techniques that may work well for you. These may include: Breathing exercises. Routines to release muscle tension. Visualizing peaceful scenes. Make sure that you drive carefully. Do not drive if you feel very sleepy. Keep all follow-up visits. This is important. Contact a health care provider if: You are tired throughout the day. You have trouble in your daily routine due to sleepiness. You continue to have sleep problems, or your sleep problems get worse. Get help right away if: You have thoughts about hurting yourself or someone else. Get help right away if you feel like you may hurt yourself or others, or have thoughts about taking your own life. Go to your nearest emergency room or: Call 911. Call the National Suicide Prevention Lifeline at 5744804241 or 988. This is open 24 hours a day. Text the Crisis Text Line at 951-039-9524. Summary Insomnia is a sleep disorder that makes it difficult to fall asleep or stay asleep. Insomnia can be long-term (chronic) or short-term (acute). Treatment for insomnia depends on the cause. Treatment may focus on treating an underlying condition that is causing the insomnia. Keep a sleep diary to help you and your health care provider figure out what could be causing your insomnia. This information is not intended to replace advice  given to you by your health care provider. Make sure you discuss any questions you have with your health care provider. Document Revised: 09/05/2021 Document Reviewed: 09/05/2021 Elsevier Patient Education  2024 [employer].

## 2023-05-20 DIAGNOSIS — G4730 Sleep apnea, unspecified: Secondary | ICD-10-CM | POA: Insufficient documentation

## 2023-05-20 DIAGNOSIS — G4733 Obstructive sleep apnea (adult) (pediatric): Secondary | ICD-10-CM | POA: Insufficient documentation

## 2023-05-20 NOTE — Assessment & Plan Note (Addendum)
-   Patient has a difficult time initiating sleep. He is compliant with CPAP use. He did not try Lunesta prescription. Several sleep aids in the past caused residual daytime grogginess. Recommend trying lower dose Lunesta 1mg  at bedtime. Advised patient maintain regular sleep schedule. Contact given for CBT for sleep training.

## 2023-05-20 NOTE — Assessment & Plan Note (Addendum)
-   Patient has mild OSA. Sleep study was limited due to insomnia. He was started on CPAP due to daytime sleepiness/fatigue. He is 93% compliant with CPAP use > 4 hours last 30 days and feels it has helped some but he is still extremely tired. Current CPAP pressure 5-10cm h20; Residual AHI 3.2/hour. Advised patient continue to wear CPAP, aim to get 6-8 hours of usage per night.

## 2023-07-09 ENCOUNTER — Other Ambulatory Visit: Payer: Self-pay | Admitting: Family Medicine

## 2023-07-09 DIAGNOSIS — J3089 Other allergic rhinitis: Secondary | ICD-10-CM

## 2023-08-19 ENCOUNTER — Other Ambulatory Visit: Payer: Self-pay | Admitting: Family Medicine

## 2023-08-21 ENCOUNTER — Other Ambulatory Visit: Payer: Self-pay | Admitting: Family Medicine

## 2023-08-22 ENCOUNTER — Telehealth: Payer: Self-pay | Admitting: Family Medicine

## 2023-08-22 NOTE — Telephone Encounter (Signed)
Prescription Request  08/22/2023  LOV: 11/13/2022  What is the name of the medication or equipment?   Clindamycin (CLINDAGEL) 1 % gel Testosterone (ANDROGEL PUMP) 20.25 MG/ACT (1.62%) GEL tretinoin (RETIN-A) 0.1 % cream    Have you contacted your pharmacy to request a refill? Yes   Which pharmacy would you like this sent to?   [facility] [phone] -  [address] AT [facility] OF [address] & [place] [address] Phone: [phone] Fax: [phone]    Patient notified that their request is being sent to the clinical staff for review and that they should receive a response within 2 business days.   Please advise at Mobile [phone] (mobile)

## 2023-08-23 MED ORDER — TESTOSTERONE 20.25 MG/ACT (1.62%) TD GEL: 4.0000 | Freq: Every day | TRANSDERMAL | 5 refills | Status: DC

## 2023-08-23 MED ORDER — TRETINOIN 0.1 % EX CREA: TOPICAL_CREAM | CUTANEOUS | 11 refills | Status: DC

## 2023-08-23 MED ORDER — CLINDAMYCIN PHOSPHATE 1 % EX GEL: CUTANEOUS | 11 refills | Status: DC

## 2023-08-23 NOTE — Telephone Encounter (Signed)
Done

## 2023-08-30 ENCOUNTER — Other Ambulatory Visit: Payer: Self-pay | Admitting: Family Medicine

## 2023-08-31 NOTE — Telephone Encounter (Signed)
Also requesting  tretinoin (RETIN-A) 0.1 % cream

## 2023-09-17 ENCOUNTER — Telehealth: Payer: Self-pay | Admitting: Primary Care

## 2023-09-17 ENCOUNTER — Ambulatory Visit: Payer: Medicare Other | Admitting: Primary Care

## 2023-09-17 ENCOUNTER — Encounter: Payer: Self-pay | Admitting: Primary Care

## 2023-09-17 VITALS — BP 116/76 | HR 76 | Temp 97.6°F | Ht 75.0 in | Wt 228.6 lb

## 2023-09-17 DIAGNOSIS — G4733 Obstructive sleep apnea (adult) (pediatric): Secondary | ICD-10-CM

## 2023-09-17 DIAGNOSIS — F5101 Primary insomnia: Secondary | ICD-10-CM | POA: Diagnosis not present

## 2023-09-17 DIAGNOSIS — G4730 Sleep apnea, unspecified: Secondary | ICD-10-CM | POA: Diagnosis not present

## 2023-09-17 NOTE — Telephone Encounter (Signed)
Bernard James is not extended release so should be fine to split. Taste may be less than pleasant though unfortunately.  Bernard James is not on the Do Not Crush List and additionally does not specifically include information about avoiding cutting, spliting, or crushing.  Chesley Mires, PharmD, MPH, BCPS, CPP Clinical Pharmacist (Rheumatology and Pulmonology)

## 2023-09-17 NOTE — Progress Notes (Signed)
@Patient  ID: Bernard James, male    DOB: 1961/09/19, 62 y.o.   MRN: [id number]  No chief complaint on file.   Referring provider: Nelwyn Salisbury, MD  HPI: 62 year old male, never smoked.  Past medical history significant for OSA.  Patient of Dr. Wynona Neat.   Previous [facility] pulmonary encounter:  05/16/2023 Patient has mild OSA and was started on CPAP for excessive daytime sleepiness. He is compliant with CPAP use and feels it has helped some but is still very tired. He has a really difficult time sleeping, he is reluctant to take medication for insomnia. It can take several hours for him to fall asleep. He has tried sleep aids in the past but several have causes residual daytime grogginess. He was prescribed lunesta 2mg  during his last OV but did not try taking. He takes xanax as needed for anxiety.  Airview download 04/15/2023 - 05/14/2023 Usage days 30/30 days (100%); 28 days (93%) greater than 4 hours Average usage 7 hours 47 minutes Pressure 5 to 10 cm H2O (7.5 cm H2O-95%) Air leaks 25 L/min (95%) AHI 3.2   09/17/2023- Interim hx Discussed the use of AI scribe software for clinical note transcription with the patient, who gave verbal consent to proceed.  Patient presents today for 3-4 month follow-up/ mild OSA.  Patient has mild sleep apnea.  Sleep study was limited due to insomnia.  He was started on CPAP due to daytime sleepiness and fatigue.  Patient is 97% compliant with CPAP use greater than 4 hours over the last 30 days.  Current pressure setting 5 to 10 cm H2O without significant residual apneas. During his last office visit patient reports difficulty initiating sleep and was started on low-dose Lunesta 1 mg at bedtime for insomnia symptoms.  He was also given contact information for CBT for sleep training.  CPAP therapy has been effective, with no significant residual apnea noted. However, the patient reports discomfort with the CPAP mask, describing it as an "aggravation" and stating  it "messes up" his nose, causing wrinkles and requiring a perfect fit to ensure a good seal.  In addition to sleep apnea, the patient has been struggling with insomnia. He was previously prescribed Lunesta during our last OV to aid with sleep initiation, but has not yet taken it due to concerns about potential side effects, citing previous adverse experiences with Ambien. The patient has a history of taking Xanax for a long period and finds it challenging to discontinue its use completely.  The patient's insomnia has been severe enough to cause him to seek medical attention, with recent episodes of minimal sleep. He expresses a willingness to try Lunesta, but with caution, suggesting he might start with a half dose. He also expresses a desire for a solution to the discomfort caused by the CPAP mask, showing interest in potential alternatives such as nasal pads, liners, or a different mask model.      Airview download 08/15/2023 - 09/13/2023 Usage days 29/30 days (97%) > than 4 hours Average usage 8 hours 8 minutes Pressure 5 to 10 cm H2O (7.5 cm H2O-95%) Air leaks 21.6 L/min (95%) AHI 0.3  Allergies  Allergen Reactions   Flagyl [Metronidazole] Other (See Comments)    Dizziness    Doxycycline Hyclate Nausea Only    REACTION: stomach upset    Immunization History  Administered Date(s) Administered   Influenza Whole 07/11/2008, 07/08/2010   Influenza,inj,Quad PF,6+ Mos 08/21/2013, 06/23/2014, 08/03/2015, 07/18/2016, 08/07/2017, 08/16/2018   Td 08/13/2009  Past Medical History:  Diagnosis Date   Allergy    allergic rhinitis   Anxiety    Depression    GERD (gastroesophageal reflux disease)    Hyperlipidemia    Stroke (HCC)    in cerebellum   Tension headache     Tobacco History: Social History   Tobacco Use  Smoking Status Never  Smokeless Tobacco Never   Counseling given: Not Answered   Outpatient Medications Prior to Visit  Medication Sig Dispense Refill    albuterol (PROVENTIL) (2.5 MG/3ML) 0.083% nebulizer solution Take 3 mLs (2.5 mg total) by nebulization every 6 (six) hours as needed for wheezing or shortness of breath. 75 mL 5   albuterol (VENTOLIN HFA) 108 (90 Base) MCG/ACT inhaler Inhale 2 puffs into the lungs every 6 (six) hours as needed for wheezing or shortness of breath.     ALPRAZolam (XANAX) 0.25 MG tablet TAKE 1 TABLET(0.25 MG) BY MOUTH THREE TIMES DAILY AS NEEDED FOR ANXIETY 90 tablet 5   aspirin 81 MG tablet Take 81 mg by mouth daily.       clindamycin (CLINDAGEL) 1 % gel APPLY TOPICALLY TO THE AFFECTED AREA TWICE DAILY 30 g 11   desoximetasone (TOPICORT) 0.25 % cream Apply 1 Application topically 2 (two) times daily. 30 g 5   eszopiclone (LUNESTA) 1 MG TABS tablet Take 1 tablet (1 mg total) by mouth at bedtime as needed for sleep. Take immediately before bedtime 30 tablet 0   famotidine (PEPCID) 20 MG tablet Take 20 mg by mouth 2 (two) times daily as needed for heartburn or indigestion. take half a tablet of the 20 MG twice daily     fluticasone (FLONASE) 50 MCG/ACT nasal spray SHAKE LIQUID AND USE 2 SPRAYS IN EACH NOSTRIL DAILY AS NEEDED 48 g 0   Testosterone (ANDROGEL PUMP) 20.25 MG/ACT (1.62%) GEL Place 4 Squirts onto the skin daily. 75 g 5   tretinoin (RETIN-A) 0.1 % cream APPLY EXTERNALLY TO THE AFFECTED AREA AT BEDTIME 20 g 11   No facility-administered medications prior to visit.    Review of Systems  Review of Systems  Constitutional:  Positive for fatigue.  HENT: Negative.  Negative for congestion.   Respiratory: Negative.    Cardiovascular: Negative.    Physical Exam  There were no vitals taken for this visit. Physical Exam Constitutional:      Appearance: Normal appearance.  HENT:     Head: Normocephalic and atraumatic.     Mouth/Throat:     Mouth: Mucous membranes are moist.     Pharynx: Oropharynx is clear.  Cardiovascular:     Rate and Rhythm: Normal rate and regular rhythm.  Pulmonary:     Effort:  Pulmonary effort is normal.     Breath sounds: Normal breath sounds.  Musculoskeletal:        General: Normal range of motion.  Skin:    General: Skin is warm and dry.  Neurological:     General: No focal deficit present.     Mental Status: He is alert and oriented to person, place, and time. Mental status is at baseline.  Psychiatric:        Mood and Affect: Mood normal.        Behavior: Behavior normal.        Thought Content: Thought content normal.        Judgment: Judgment normal.      Lab Results:  CBC    Component Value Date/Time   WBC 6.0 07/24/2022  1609   RBC 4.98 07/24/2022 1609   HGB 15.2 07/24/2022 1609   HGB 14.8 06/04/2014 1614   HCT 44.0 07/24/2022 1609   HCT 43.7 06/04/2014 1614   PLT 233.0 07/24/2022 1609   PLT 192 06/04/2014 1614   MCV 88.4 07/24/2022 1609   MCV 89 06/04/2014 1614   MCH 30.8 05/21/2020 1521   MCHC 34.5 07/24/2022 1609   RDW 13.5 07/24/2022 1609   RDW 13.2 06/04/2014 1614   LYMPHSABS 1.0 07/24/2022 1609   LYMPHSABS 0.8 (L) 06/04/2014 1614   MONOABS 0.4 07/24/2022 1609   MONOABS 0.3 06/04/2014 1614   EOSABS 0.3 07/24/2022 1609   EOSABS 0.1 06/04/2014 1614   BASOSABS 0.1 07/24/2022 1609   BASOSABS 0.1 06/04/2014 1614    BMET    Component Value Date/Time   NA 139 07/24/2022 1609   NA 141 06/04/2014 1614   K 4.0 07/24/2022 1609   K 3.8 06/04/2014 1614   CL 104 07/24/2022 1609   CL 106 06/04/2014 1614   CO2 27 07/24/2022 1609   CO2 26 06/04/2014 1614   GLUCOSE 92 07/24/2022 1609   GLUCOSE 98 06/04/2014 1614   BUN 11 07/24/2022 1609   BUN 13 06/04/2014 1614   CREATININE 1.03 07/24/2022 1609   CREATININE 1.11 05/21/2020 1521   CALCIUM 9.9 07/24/2022 1609   CALCIUM 9.1 06/04/2014 1614   GFRNONAA >60 08/24/2017 1410   GFRNONAA >60 06/04/2014 1614   GFRAA >60 08/24/2017 1410   GFRAA >60 06/04/2014 1614    BNP No results found for: "BNP"  ProBNP No results found for: "PROBNP"  Imaging: No results  found.   Assessment & Plan:   1. Mild sleep apnea  2. Primary insomnia     Sleep Apnea Mild sleep apnea managed with CPAP. Patient is compliant with CPAP use and has no significant residual apnea. However, patient reports discomfort with the nasal mask. -Consider ResMed AirTouch N20 memory foam nasal CPAP mask cushion or CPAP liners for nasal mask to improve comfort. -Continue current CPAP settings 5-15cm h20   Insomnia Patient has not tried Lunesta due to concerns about side effects. Patient has previously tried Ambien with adverse effects. Xanax has not been beneficial for sleep maintenance. -Encourage patient to try Lunesta 1mg  at bedtime, ensuring a dedicated 6-8 hours of sleep after taking the medication. -Check with pharmacy regarding the possibility of splitting the Lunesta tablet. -Do not take Lunesta and Xanax together; ensure a 4-6 hour gap between these medications. -Follow up in 3 months or sooner if patient experiences any issues with the new medication.         Glenford Bayley, NP 09/17/2023

## 2023-09-17 NOTE — Patient Instructions (Signed)
-  SLEEP APNEA: Sleep apnea is a condition where breathing repeatedly stops and starts during sleep. You are managing it well with your CPAP therapy, but you are experiencing discomfort with the nasal mask. We recommend trying the ResMed AirTouch N20 memory foam nasal CPAP mask cushion or CPAP liners to improve comfort. Continue with your current CPAP settings.  -INSOMNIA: Insomnia is difficulty falling or staying asleep. You have been hesitant to try Lunesta due to concerns about side effects. We encourage you to try Lunesta 1mg  at bedtime, ensuring you have 6-8 hours of sleep after taking it. We will check with our pharmacy about splitting the tablet. Do not take Lunesta and Xanax together; ensure a 4-6 hour gap between these medications. Follow up in 3 months or sooner if you experience any issues with the new medication.  Orders: DME for mask fitting resmed N20 nasal mask (ordered)  Follow-up Please follow up in 3 months  CPAP/insomnia

## 2023-09-17 NOTE — Telephone Encounter (Signed)
Can you cut eszopiclone (Lunesta) 1mg  in half?

## 2023-09-18 NOTE — Telephone Encounter (Signed)
Please let patient know that he can cut lunesta tablet in half

## 2023-09-28 NOTE — Telephone Encounter (Signed)
Left message on VM for patient call regarding Lunesta dose.  Will send MyChart message.

## 2023-10-08 ENCOUNTER — Other Ambulatory Visit: Payer: Self-pay | Admitting: Family Medicine

## 2023-10-08 DIAGNOSIS — J3089 Other allergic rhinitis: Secondary | ICD-10-CM

## 2023-10-15 ENCOUNTER — Ambulatory Visit: Payer: Self-pay | Admitting: Family Medicine

## 2023-10-15 NOTE — Telephone Encounter (Signed)
 Copied from CRM [id number]. Topic: Clinical - Red Word Triage >> Oct 15, 2023  1:37 PM Taleah C wrote: Red Word that prompted transfer to Nurse Triage: pt called and stated that he has an allergy where his sinuses becomes inflamed and he has trouble breathing. He stated that sxs started on Friday.   Chief Complaint:  Patient  reports Allergic Rhinitis leading to his  Sinus Pressure and Sinus Headache Symptoms:  Patient reports  Pain and pressure in sinus areas Frequency:  continuous pain and discomfort Pertinent Negatives: Patient denies fever nor any shortness of breath at this time. Disposition: [] ED /[] Urgent Care (no appt availability in office) / [x] Appointment(In office/virtual)/ []  [facility]/ [] Home Care/ [] Refused Recommended Disposition /[] [facility] [facility]/ []  Follow-up with PCP Additional Notes: Patient was taking Flonase  but has stopped because he wasn't getting any better. Patient is  requesting  an appointment with  Dr. Johnny for tomorrow  afternoon. Reason for Disposition  [1] SEVERE pain AND [2] not improved 2 hours after pain medicine  Answer Assessment - Initial Assessment Questions 1. LOCATION: Where does it hurt?       Sinus Pressure  , Sinus Headache 2. ONSET: When did the sinus pain start?  (e.g., hours, days)       Last Friday 3. SEVERITY: How bad is the pain?   (Scale 1-10; mild, moderate or severe)  - MODERATE (4-7): interferes with normal activities (e.g., work or school) or awakens from sleep    4. RECURRENT SYMPTOM: Have you ever had sinus problems before? If Yes, ask: When was the last time? and What happened that time?       Yes. Prednisone shot 5. NASAL CONGESTION: Is the nose blocked? If Yes, ask: Can you open it or must you breathe through your mouth?      Yes Blocked and then runny again  6. NASAL DISCHARGE: Do you have discharge from your nose? If so ask, What color?      Clear 7. FEVER: Do you have a fever? If Yes,  ask: What is it, how was it measured, and when did it start?      Denies  Fever 8. OTHER SYMPTOMS: Do you have any other symptoms? (e.g., sore throat, cough, earache, difficulty breathing)      No difficulty breathing at this time.  Protocols used: Sinus Pain or Congestion-A-AH

## 2023-10-17 ENCOUNTER — Ambulatory Visit: Payer: Medicare Other | Admitting: Family Medicine

## 2023-10-17 ENCOUNTER — Encounter: Payer: Self-pay | Admitting: Family Medicine

## 2023-10-17 VITALS — BP 134/80 | HR 75 | Temp 97.5°F | Wt 231.7 lb

## 2023-10-17 DIAGNOSIS — J309 Allergic rhinitis, unspecified: Secondary | ICD-10-CM | POA: Diagnosis not present

## 2023-10-17 MED ORDER — AMOXICILLIN 500 MG PO CAPS: 500.0000 mg | ORAL_CAPSULE | Freq: Three times a day (TID) | ORAL | 0 refills | Status: AC

## 2023-10-17 MED ORDER — METHYLPREDNISOLONE ACETATE 80 MG/ML IJ SUSP
80.0000 mg | Freq: Once | INTRAMUSCULAR | Status: AC
  Administered 2023-10-17: 80 mg via INTRAMUSCULAR

## 2023-10-17 MED ORDER — AMOXICILLIN 875 MG PO TABS: 875.0000 mg | ORAL_TABLET | Freq: Two times a day (BID) | ORAL | 0 refills | Status: DC

## 2023-10-17 NOTE — Progress Notes (Signed)
 Established Patient Office Visit  Subjective   Patient ID: Bernard James, male    DOB: 07-15-1961  Age: 63 y.o. MRN: [id number]  Chief Complaint  Patient presents with   Sinusitis    Patient complains of sinusitis, x5 days, Tried Tylenol, Flonase  and Robitussin    Headache    HPI   Bernard James is seen as a work in with at least 5-day history of sinusitis symptoms.  He had some sneezing, cough, nasal congestion.  No fever.  Occasional headaches.  Took some Tylenol, Flonase , and Robitussin without much improvement.  He has some concern whether this may be allergies versus early sinus infection.  No significant cough.  No bloody or purulent secretions.  No upper teeth pain.  He states he has received Depo-Medrol  in the past for allergy symptoms with nasal steroids and antihistamines have not worked.  They seemed to have benefited him in the past.  Past Medical History:  Diagnosis Date   Allergy    allergic rhinitis   Anxiety    Depression    GERD (gastroesophageal reflux disease)    Hyperlipidemia    Stroke (HCC)    in cerebellum   Tension headache    Past Surgical History:  Procedure Laterality Date   HERNIA REPAIR     bilateral, inguinal   LIGAMENT REPAIR     repair of torn ligament in left ankle   TONSILLECTOMY      reports that he has never smoked. He has never used smokeless tobacco. He reports that he does not drink alcohol and does not use drugs. family history includes Arthritis in an other family member; Atrial fibrillation in his father; Cancer in an other family member; Fibromyalgia in an other family member; Hypertension in an other family member; Sjogren's syndrome in his sister. Allergies  Allergen Reactions   Flagyl  [Metronidazole ] Other (See Comments)    Dizziness    Doxycycline  Hyclate Nausea Only    REACTION: stomach upset    Review of Systems  Constitutional:  Negative for chills and fever.  HENT:  Positive for congestion and sinus pain. Negative  for ear pain and sore throat.   Respiratory:  Negative for cough.   Neurological:  Positive for headaches.      Objective:     BP 134/80 (BP Location: Left Arm, Patient Position: Sitting, Cuff Size: Large)   Pulse 75   Temp (!) 97.5 F (36.4 C) (Oral)   Wt 231 lb 11.2 oz (105.1 kg)   SpO2 98%   BMI 28.96 kg/m  BP Readings from Last 3 Encounters:  10/17/23 134/80  09/17/23 116/76  05/16/23 118/78   Wt Readings from Last 3 Encounters:  10/17/23 231 lb 11.2 oz (105.1 kg)  09/17/23 228 lb 9.6 oz (103.7 kg)  05/16/23 227 lb 9.6 oz (103.2 kg)      Physical Exam Vitals reviewed.  Constitutional:      General: He is not in acute distress.    Appearance: He is not ill-appearing.  HENT:     Mouth/Throat:     Mouth: Mucous membranes are moist.     Pharynx: Oropharynx is clear.  Cardiovascular:     Rate and Rhythm: Normal rate and regular rhythm.  Pulmonary:     Effort: Pulmonary effort is normal.     Breath sounds: Normal breath sounds. No wheezing or rales.  Musculoskeletal:     Cervical back: Neck supple.  Neurological:     Mental Status: He is alert.  No results found for any visits on 10/17/23.    The ASCVD Risk score (Arnett DK, et al., 2019) failed to calculate for the following reasons:   Risk score cannot be calculated because patient has a medical history suggesting prior/existing ASCVD    Assessment & Plan:   Acute sinusitis symptoms.  Differential is allergic versus viral versus bacterial.  We recommended not starting antibiotics this point is not clear if this is bacterial.  We did print prescription for amoxicillin  500 g 3 times daily for 10 days to start if his symptoms worsen or persist.  Patient already on Flonase  and states he is having a lot of sneezing after getting up some leaves and has not benefited from oral antihistamines as much in the past.  He is requesting IM steroids which have helped previously.  Depo-Medrol  80 mg given.  Continue  Flonase  and over-the-counter nonsedating oral antihistamine.  Follow-up for any persistent or worsening symptoms  Wolm Scarlet, MD

## 2023-10-17 NOTE — Addendum Note (Signed)
 Addended by: Christy Sartorius on: 10/17/2023 03:28 PM   Modules accepted: Orders

## 2023-12-20 ENCOUNTER — Encounter: Payer: Self-pay | Admitting: Internal Medicine

## 2023-12-20 ENCOUNTER — Ambulatory Visit: Payer: Medicare Other | Admitting: Internal Medicine

## 2023-12-20 VITALS — BP 112/82 | HR 70 | Temp 97.6°F | Ht 75.0 in | Wt 224.0 lb

## 2023-12-20 DIAGNOSIS — G4700 Insomnia, unspecified: Secondary | ICD-10-CM

## 2023-12-20 DIAGNOSIS — F5101 Primary insomnia: Secondary | ICD-10-CM

## 2023-12-20 DIAGNOSIS — G4733 Obstructive sleep apnea (adult) (pediatric): Secondary | ICD-10-CM

## 2023-12-20 MED ORDER — ZOLPIDEM TARTRATE 5 MG PO TABS: 5.0000 mg | ORAL_TABLET | Freq: Every evening | ORAL | 0 refills | Status: DC | PRN

## 2023-12-20 NOTE — Progress Notes (Signed)
 SYNOPSIS 62 year old male, never smoked.  Past medical history significant for OSA.  Patient of Dr. Wynona Neat.   PSG March 2024 AHI of 7.4 REM AHI is 21.8 Supine AHI is 11.5  Airview download 04/15/2023 - 05/14/2023 Usage days 30/30 days (100%); 28 days (93%) greater than 4 hours Average usage 7 hours 47 minutes Pressure 5 to 10 cm H2O (7.5 cm H2O-95%) Air leaks 25 L/min (95%) AHI 3.2  Airview download 08/15/2023 - 09/13/2023 Usage days 29/30 days (97%) > than 4 hours Average usage 8 hours 8 minutes Pressure 5 to 10 cm H2O (7.5 cm H2O-95%) Air leaks 21.6 L/min (95%) AHI 0.3  CC Follow up assessment of OSA  HPI CPAP download reviewed in detail today 100% compliance AHI significant reduced down to 2.3 On CPAP 5-10 Patient uses and benefits from therapy Using CPAP nightly and with naps Pressure setting is comfortable and is sleeping well.  No exacerbation at this time No evidence of heart failure at this time No evidence or signs of infection at this time No respiratory distress No fevers, chills, nausea, vomiting, diarrhea No evidence of lower extremity edema No evidence hemoptysis   Allergies  Allergen Reactions   Flagyl [Metronidazole] Other (See Comments)    Dizziness    Doxycycline Hyclate Nausea Only    REACTION: stomach upset    Immunization History  Administered Date(s) Administered   Influenza Whole 07/11/2008, 07/08/2010   Influenza,inj,Quad PF,6+ Mos 08/21/2013, 06/23/2014, 08/03/2015, 07/18/2016, 08/07/2017, 08/16/2018   Td 08/13/2009    Past Medical History:  Diagnosis Date   Allergy    allergic rhinitis   Anxiety    Depression    GERD (gastroesophageal reflux disease)    Hyperlipidemia    Stroke (HCC)    in cerebellum   Tension headache     Tobacco History: Social History   Tobacco Use  Smoking Status Never  Smokeless Tobacco Never   Counseling given: Not Answered   Outpatient Medications Prior to Visit  Medication Sig  Dispense Refill   albuterol (PROVENTIL) (2.5 MG/3ML) 0.083% nebulizer solution Take 3 mLs (2.5 mg total) by nebulization every 6 (six) hours as needed for wheezing or shortness of breath. 75 mL 5   albuterol (VENTOLIN HFA) 108 (90 Base) MCG/ACT inhaler Inhale 2 puffs into the lungs every 6 (six) hours as needed for wheezing or shortness of breath.     ALPRAZolam (XANAX) 0.25 MG tablet TAKE 1 TABLET(0.25 MG) BY MOUTH THREE TIMES DAILY AS NEEDED FOR ANXIETY 90 tablet 5   aspirin 81 MG tablet Take 81 mg by mouth daily.       clindamycin (CLINDAGEL) 1 % gel APPLY TOPICALLY TO THE AFFECTED AREA TWICE DAILY 30 g 11   desoximetasone (TOPICORT) 0.25 % cream Apply 1 Application topically 2 (two) times daily. 30 g 5   eszopiclone (LUNESTA) 1 MG TABS tablet Take 1 tablet (1 mg total) by mouth at bedtime as needed for sleep. Take immediately before bedtime 30 tablet 0   famotidine (PEPCID) 20 MG tablet Take 20 mg by mouth 2 (two) times daily as needed for heartburn or indigestion. take half a tablet of the 20 MG twice daily     fluticasone (FLONASE) 50 MCG/ACT nasal spray SHAKE LIQUID AND USE 2 SPRAYS IN EACH NOSTRIL DAILY AS NEEDED 48 g 0   Testosterone (ANDROGEL PUMP) 20.25 MG/ACT (1.62%) GEL Place 4 Squirts onto the skin daily. 75 g 5   tretinoin (RETIN-A) 0.1 % cream APPLY EXTERNALLY TO  THE AFFECTED AREA AT BEDTIME 20 g 11   No facility-administered medications prior to visit.    BP 112/82 (BP Location: Left Arm, Patient Position: Sitting, Cuff Size: Normal)   Pulse 70   Temp 97.6 F (36.4 C) (Temporal)   Ht 6\' 3"  (1.905 m)   Wt 224 lb (101.6 kg)   SpO2 96%   BMI 28.00 kg/m       Review of Systems: Gen:  Denies  fever, sweats, chills weight loss  HEENT: Denies blurred vision, double vision, ear pain, eye pain, hearing loss, nose bleeds, sore throat Cardiac:  No dizziness, chest pain or heaviness, chest tightness,edema, No JVD Resp:   No cough, -sputum production, -shortness of  breath,-wheezing, -hemoptysis,  Other:  All other systems negative   Physical Examination:   General Appearance: No distress  EYES PERRLA, EOM intact.   NECK Supple, No JVD Pulmonary: normal breath sounds, No wheezing.  CardiovascularNormal S1,S2.  No m/r/g.   Abdomen: Benign, Soft, non-tender. Neurology UE/LE 5/5 strength, no focal deficits Ext pulses intact, cap refill intact ALL OTHER ROS ARE NEGATIVE    Lab Results:  CBC    Component Value Date/Time   WBC 6.0 07/24/2022 1609   RBC 4.98 07/24/2022 1609   HGB 15.2 07/24/2022 1609   HGB 14.8 06/04/2014 1614   HCT 44.0 07/24/2022 1609   HCT 43.7 06/04/2014 1614   PLT 233.0 07/24/2022 1609   PLT 192 06/04/2014 1614   MCV 88.4 07/24/2022 1609   MCV 89 06/04/2014 1614   MCH 30.8 05/21/2020 1521   MCHC 34.5 07/24/2022 1609   RDW 13.5 07/24/2022 1609   RDW 13.2 06/04/2014 1614   LYMPHSABS 1.0 07/24/2022 1609   LYMPHSABS 0.8 (L) 06/04/2014 1614   MONOABS 0.4 07/24/2022 1609   MONOABS 0.3 06/04/2014 1614   EOSABS 0.3 07/24/2022 1609   EOSABS 0.1 06/04/2014 1614   BASOSABS 0.1 07/24/2022 1609   BASOSABS 0.1 06/04/2014 1614    BMET    Component Value Date/Time   NA 139 07/24/2022 1609   NA 141 06/04/2014 1614   K 4.0 07/24/2022 1609   K 3.8 06/04/2014 1614   CL 104 07/24/2022 1609   CL 106 06/04/2014 1614   CO2 27 07/24/2022 1609   CO2 26 06/04/2014 1614   GLUCOSE 92 07/24/2022 1609   GLUCOSE 98 06/04/2014 1614   BUN 11 07/24/2022 1609   BUN 13 06/04/2014 1614   CREATININE 1.03 07/24/2022 1609   CREATININE 1.11 05/21/2020 1521   CALCIUM 9.9 07/24/2022 1609   CALCIUM 9.1 06/04/2014 1614   GFRNONAA >60 08/24/2017 1410   GFRNONAA >60 06/04/2014 1614   GFRAA >60 08/24/2017 1410   GFRAA >60 06/04/2014 1614    Assessment & Plan:   63 year old pleasant white male seen today for mild to moderate sleep apnea in the setting of insomnia   Assessment of Sleep apnea Patient has excellent compliance  report Discussed in detail with patient OSA is well-controlled with CPAP Continue current prescription Patient uses and benefits from therapy Using CPAP nightly.  pressure setting is comfortable and she is sleeping well. auto CPAP 5-10 AHI reduced to 0.3  Patient Instructions Continue to use CPAP every night, minimum of 4-6 hours a night.  Change equipment every 30 days or as directed by DME.  Wash your tubing with warm soap and water daily, hang to dry. Wash humidifier portion weekly. Use bottled, distilled water and change daily   Be aware of reduced alertness and do  not drive or operate heavy machinery if experiencing this or drowsiness.  Exercise encouraged, as tolerated. Encouraged proper weight management.  Important to get eight or more hours of sleep  Limiting the use of the computer and television before bedtime.  Decrease naps during the day, so night time sleep will become enhanced.  Limit caffeine, and sleep deprivation.  HTN, stroke, uncontrolled diabetes and heart failure are potential risk factors.  Risk of untreated sleep apnea including cardiac arrhthymias, stroke, DM, pulm HTN.  ResMed AirTouch N20 memory foam nasal CPAP mask cushion   Insomnia Patient has not tried Lunesta due to concerns about side effects. Patient has previously tried Ambien with adverse effects. Xanax has not been beneficial for sleep maintenance. Patient went back to very low-dose Xanax which helps his insomnia   MEDICATION ADJUSTMENTS/LABS AND TESTS ORDERED: Continue CPAP as prescribed Use Ambien as needed for insomnia Avoid Allergens and Irritants Avoid secondhand smoke Avoid SICK contacts Recommend  Masking  when appropriate Recommend Keep up-to-date with vaccinations   CURRENT MEDICATIONS REVIEWED AT LENGTH WITH PATIENT TODAY   Patient  satisfied with Plan of action and management. All questions answered   Follow up 6 months   I spent a total of 46 minutes reviewing chart  data, face-to-face evaluation with the patient, counseling and coordination of care as detailed above.      Lucie Leather, M.D.  [facility]  Medical Director [facility] Medical Director [facility] Cardio-Pulmonary Department

## 2023-12-20 NOTE — Patient Instructions (Addendum)
 Excellent Job A+ GOLD STAR!!  Continue CPAP as prescribed  Avoid Allergens and Irritants Avoid secondhand smoke Avoid SICK contacts Recommend  Masking  when appropriate Recommend Keep up-to-date with vaccinations   Be aware of reduced alertness and do not drive or operate heavy machinery if experiencing this or drowsiness.  Exercise encouraged, as tolerated. Encouraged proper weight management.  Important to get eight or more hours of sleep  Limiting the use of the computer and television before bedtime.  Decrease naps during the day, so night time sleep will become enhanced.  Limit caffeine, and sleep deprivation.   Use Ambien as needed for insomnia

## 2024-01-13 ENCOUNTER — Other Ambulatory Visit: Payer: Self-pay | Admitting: Family Medicine

## 2024-01-13 DIAGNOSIS — J3089 Other allergic rhinitis: Secondary | ICD-10-CM

## 2024-04-17 ENCOUNTER — Other Ambulatory Visit: Payer: Self-pay | Admitting: Family Medicine

## 2024-04-23 ENCOUNTER — Telehealth: Payer: Self-pay

## 2024-04-23 ENCOUNTER — Other Ambulatory Visit (HOSPITAL_COMMUNITY): Payer: Self-pay

## 2024-04-23 NOTE — Telephone Encounter (Signed)
 Pharmacy Patient Advocate Encounter   Received notification from Onbase that prior authorization for Testosterone  1.62% gel  is required/requested.   Insurance verification completed.   The patient is insured through [employer] .   Per test claim: PA required; PA submitted to above mentioned insurance via CoverMyMeds Key/confirmation #/EOC [id number] Status is pending

## 2024-04-24 ENCOUNTER — Telehealth: Payer: Self-pay

## 2024-04-24 ENCOUNTER — Other Ambulatory Visit (HOSPITAL_COMMUNITY): Payer: Self-pay

## 2024-04-24 NOTE — Telephone Encounter (Signed)
 Left detailed message for pt advised of approval for his Testosterone  Rx, advised to pick prescription if not already. Copied from CRM [id number]. Topic: General - Other >> Apr 23, 2024 12:19 PM Rosina BIRCH wrote: Reason for CRM: hannah from [employer] called stating the patient Testerone gel is approved from 04/23/2024 til 04/23/2025 CB [phone] option 5

## 2024-04-25 ENCOUNTER — Other Ambulatory Visit (HOSPITAL_COMMUNITY): Payer: Self-pay

## 2024-04-25 NOTE — Telephone Encounter (Signed)
 Pharmacy Patient Advocate Encounter  Received notification from [facility] that Prior Authorization for Testosterone  1.62% gel  has been APPROVED from 04/23/24 to 04/23/25. Ran test claim, Copay is $25.94. This test claim was processed through [facility]- copay amounts may vary at other pharmacies due to pharmacy/plan contracts, or as the patient moves through the different stages of their insurance plan.   PA #/Case ID/Reference #: [id number]

## 2024-04-29 ENCOUNTER — Telehealth: Payer: Self-pay

## 2024-04-29 ENCOUNTER — Other Ambulatory Visit (HOSPITAL_COMMUNITY): Payer: Self-pay

## 2024-04-29 NOTE — Telephone Encounter (Signed)
 Set up an in person visit so we can evaluate his skin issues

## 2024-04-29 NOTE — Telephone Encounter (Signed)
 Pharmacy Patient Advocate Encounter   Received notification from CoverMyMeds that prior authorization for Tretinoin  0.1% cream  is required/requested.   Insurance verification completed.   The patient is insured through [employer] .   Per test claim: I am not finding any updated notes about the [place] for this medication. The only thing I see is back from 2015. That is too old, the insurance is likely to deny it with chart notes that old. Please let me know when current chart notes are added. Thanks!

## 2024-05-01 NOTE — Telephone Encounter (Signed)
 Patient is aware.  Patient will call back to schedule an in office visit.

## 2024-05-07 ENCOUNTER — Other Ambulatory Visit (HOSPITAL_COMMUNITY): Payer: Self-pay

## 2024-05-21 ENCOUNTER — Other Ambulatory Visit: Payer: Self-pay | Admitting: Internal Medicine

## 2024-05-21 DIAGNOSIS — F5101 Primary insomnia: Secondary | ICD-10-CM

## 2024-05-21 NOTE — Telephone Encounter (Signed)
 Copied from CRM #[id number]. Topic: Clinical - Medication Refill >> May 21, 2024  3:09 PM Russell PARAS wrote: Medication: zolpidem  (AMBIEN ) 5 MG tablet   Has the patient contacted their pharmacy? Yes (Agent: If no, request that the patient contact the pharmacy for the refill. If patient does not wish to contact the pharmacy document the reason why and proceed with request.) (Agent: If yes, when and what did the pharmacy advise?)  This is the patient's preferred pharmacy:  [id number] - [place], [place] - [address] AT [facility] [address] [facility] [address] [place] [address] Phone: [phone] Fax: [phone]  Is this the correct pharmacy for this prescription? Yes If no, delete pharmacy and type the correct one.   Has the prescription been filled recently? Yes  Is the patient out of the medication? No  Has the patient been seen for an appointment in the last year OR does the patient have an upcoming appointment? Yes, 09/16  Can we respond through MyChart? Yes  Agent: Please be advised that Rx refills may take up to 3 business days. We ask that you follow-up with your pharmacy.

## 2024-05-21 NOTE — Telephone Encounter (Unsigned)
 Copied from CRM #[id number]. Topic: Clinical - Medication Refill >> May 21, 2024  3:09 PM Russell PARAS wrote: Medication: zolpidem  (AMBIEN ) 5 MG tablet   Has the patient contacted their pharmacy? Yes (Agent: If no, request that the patient contact the pharmacy for the refill. If patient does not wish to contact the pharmacy document the reason why and proceed with request.) (Agent: If yes, when and what did the pharmacy advise?)  This is the patient's preferred pharmacy:  [id number] - [place], [place] - [address] AT [facility] [address] [facility] [address] [place] [address] Phone: [phone] Fax: [phone]  Is this the correct pharmacy for this prescription? Yes If no, delete pharmacy and type the correct one.   Has the prescription been filled recently? Yes  Is the patient out of the medication? No  Has the patient been seen for an appointment in the last year OR does the patient have an upcoming appointment? Yes, 09/16  Can we respond through MyChart? Yes  Agent: Please be advised that Rx refills may take up to 3 business days. We ask that you follow-up with your pharmacy.

## 2024-05-21 NOTE — Telephone Encounter (Signed)
 Attempted to call patient just to verify no acute symptoms--Call cannot be completed as dialed Call would not go through.

## 2024-06-04 ENCOUNTER — Ambulatory Visit: Admitting: Sleep Medicine

## 2024-06-04 ENCOUNTER — Encounter: Payer: Self-pay | Admitting: Sleep Medicine

## 2024-06-04 VITALS — HR 70 | Temp 97.6°F | Ht 75.0 in | Wt 227.2 lb

## 2024-06-04 DIAGNOSIS — G4721 Circadian rhythm sleep disorder, delayed sleep phase type: Secondary | ICD-10-CM | POA: Diagnosis not present

## 2024-06-04 DIAGNOSIS — G4733 Obstructive sleep apnea (adult) (pediatric): Secondary | ICD-10-CM

## 2024-06-04 MED ORDER — TRAZODONE HCL 50 MG PO TABS: 50.0000 mg | ORAL_TABLET | Freq: Every day | ORAL | 3 refills | Status: DC

## 2024-06-04 NOTE — Progress Notes (Signed)
 Name:Bernard James MRN: [id number] DOB: Feb 06, 1961   CHIEF COMPLAINT:  Insomnia    HISTORY OF PRESENT ILLNESS: Bernard James is a 63 y.o. w/ a h/o OSA and anxiety who presents for difficulty with sleep initiation and sleep maintenance. Reports getting into bed around 2-3 am and not being able to fall asleep until 5-6 am most days. States that his sleep schedule has been this way for a while now. Reports using CPAP therapy consistently.    Bedtime 2-3 am Sleep onset varies Rise time 1-2 pm   EPWORTH SLEEP SCORE     08/11/2022    2:00 PM  Results of the Epworth flowsheet  Sitting and reading 0  Watching TV 0  Sitting, inactive in a public place (e.g. a theatre or a meeting) 0  As a passenger in a car for an hour without a break 0  Lying down to rest in the afternoon when circumstances permit 0  Sitting and talking to someone 0  Sitting quietly after a lunch without alcohol 0  In a car, while stopped for a few minutes in traffic 0  Total score 0    PAST MEDICAL HISTORY :   has a past medical history of Allergy, Anxiety, Depression, GERD (gastroesophageal reflux disease), Hyperlipidemia, Stroke (HCC), and Tension headache.  has a past surgical history that includes Hernia repair; Tonsillectomy; and Ligament repair. Prior to Admission medications   Medication Sig Start Date End Date Taking? Authorizing Provider  albuterol  (PROVENTIL ) (2.5 MG/3ML) 0.083% nebulizer solution Take 3 mLs (2.5 mg total) by nebulization every 6 (six) hours as needed for wheezing or shortness of breath. 01/09/22  Yes Johnny Garnette LABOR, MD  albuterol  (VENTOLIN  HFA) 108 [phone] Base) MCG/ACT inhaler Inhale 2 puffs into the lungs every 6 (six) hours as needed for wheezing or shortness of breath.   Yes [provider]  ALPRAZolam  (XANAX ) 0.25 MG tablet TAKE 1 TABLET(0.25 MG) BY MOUTH THREE TIMES DAILY AS NEEDED FOR ANXIETY 04/18/24  Yes Johnny Garnette LABOR, MD  clindamycin  (CLINDAGEL) 1 % gel APPLY  TOPICALLY TO THE AFFECTED AREA TWICE DAILY 08/23/23  Yes Johnny Garnette LABOR, MD  desoximetasone  (TOPICORT ) 0.25 % cream Apply 1 Application topically 2 (two) times daily. 05/03/22  Yes Johnny Garnette LABOR, MD  famotidine (PEPCID) 20 MG tablet Take 20 mg by mouth 2 (two) times daily as needed for heartburn or indigestion. take half a tablet of the 20 MG twice daily   Yes [provider]  fluticasone  (FLONASE ) 50 MCG/ACT nasal spray SHAKE LIQUID AND USE 2 SPRAYS IN EACH NOSTRIL DAILY AS NEEDED 01/14/24  Yes Johnny Garnette LABOR, MD  Testosterone  1.62 % GEL PLACE 4 SQUIRTS ONTO THE SKIN DAILY 04/18/24  Yes Johnny Garnette LABOR, MD  tretinoin  (RETIN-A ) 0.1 % cream APPLY EXTERNALLY TO THE AFFECTED AREA AT BEDTIME 08/23/23  Yes Johnny Garnette LABOR, MD  aspirin 81 MG tablet Take 81 mg by mouth daily.      [provider]  zolpidem  (AMBIEN ) 5 MG tablet Take 1 tablet (5 mg total) by mouth at bedtime as needed for sleep. Patient not taking: Reported on 06/04/2024 12/20/23   Kasa, Kurian, MD   Allergies  Allergen Reactions   Flagyl  Heracles.Hals ] Other (See Comments)    Dizziness    Doxycycline  Hyclate Nausea Only    REACTION: stomach upset    FAMILY HISTORY:  family history includes Arthritis in an other family member; Atrial fibrillation in his father; Cancer in  an other family member; Fibromyalgia in an other family member; Hypertension in an other family member; Sjogren's syndrome in his sister. SOCIAL HISTORY:  reports that he has never smoked. He has never used smokeless tobacco. He reports that he does not drink alcohol and does not use drugs.   Review of Systems:  Gen:  Denies  fever, sweats, chills weight loss  HEENT: Denies blurred vision, double vision, ear pain, eye pain, hearing loss, nose bleeds, sore throat Cardiac:  No dizziness, chest pain or heaviness, chest tightness,edema, No JVD Resp:   No cough, -sputum production, -shortness of breath,-wheezing, -hemoptysis,  Gi: Denies swallowing  difficulty, stomach pain, nausea or vomiting, diarrhea, constipation, bowel incontinence Gu:  Denies bladder incontinence, burning urine Ext:   Denies Joint pain, stiffness or swelling Skin: Denies  skin rash, easy bruising or bleeding or hives Endoc:  Denies polyuria, polydipsia , polyphagia or weight change Psych:   Denies depression, insomnia or hallucinations  Other:  All other systems negative  VITAL SIGNS: BP (!) 128/90   Pulse 70   Temp 97.6 F (36.4 C) (Oral)   Ht 6' 3 (1.905 m)   Wt 227 lb 3.2 oz (103.1 kg)   SpO2 98%   BMI 28.40 kg/m    Physical Examination:   General Appearance: No distress  EYES PERRLA, EOM intact.   NECK Supple, No JVD Pulmonary: normal breath sounds, No wheezing.  CardiovascularNormal S1,S2.  No m/r/g.   Abdomen: Benign, Soft, non-tender. Skin:   warm, no rashes, no ecchymosis  Extremities: normal, no cyanosis, clubbing. Neuro:without focal findings,  speech normal  PSYCHIATRIC: Mood, affect within normal limits.   ASSESSMENT AND PLAN  OSA Patient is using and benefiting from CPAP therapy. Discussed the consequences of untreated sleep apnea. Advised not to drive drowsy for safety of patient and others. Will follow up in 3 months.     Delayed sleep phase syndrome Counseled patient on keeping a regular sleep schedule on all days and advancing bedtime forward by 1 hour nightly. Will also try patient on Trazodone  50 mg nightly.    Patient  satisfied with Plan of action and management. All questions answered  I spent a total of 63 minutes reviewing chart data, face-to-face evaluation with the patient, counseling and coordination of care as detailed above.    Olvin Rohr, M.D.  Sleep Medicine [facility]

## 2024-06-04 NOTE — Patient Instructions (Addendum)
 [email or web address]

## 2024-06-06 ENCOUNTER — Encounter: Admitting: Sleep Medicine

## 2024-06-20 ENCOUNTER — Encounter: Payer: Self-pay | Admitting: Family Medicine

## 2024-06-20 ENCOUNTER — Ambulatory Visit: Admitting: Family Medicine

## 2024-06-20 VITALS — BP 120/80 | HR 64 | Temp 98.1°F | Ht 75.0 in | Wt 227.0 lb

## 2024-06-20 DIAGNOSIS — E291 Testicular hypofunction: Secondary | ICD-10-CM | POA: Diagnosis not present

## 2024-06-20 DIAGNOSIS — G4700 Insomnia, unspecified: Secondary | ICD-10-CM | POA: Diagnosis not present

## 2024-06-20 DIAGNOSIS — E538 Deficiency of other specified B group vitamins: Secondary | ICD-10-CM | POA: Diagnosis not present

## 2024-06-20 DIAGNOSIS — R739 Hyperglycemia, unspecified: Secondary | ICD-10-CM | POA: Diagnosis not present

## 2024-06-20 DIAGNOSIS — N401 Enlarged prostate with lower urinary tract symptoms: Secondary | ICD-10-CM | POA: Diagnosis not present

## 2024-06-20 DIAGNOSIS — N138 Other obstructive and reflux uropathy: Secondary | ICD-10-CM | POA: Diagnosis not present

## 2024-06-20 DIAGNOSIS — E785 Hyperlipidemia, unspecified: Secondary | ICD-10-CM | POA: Diagnosis not present

## 2024-06-20 MED ORDER — AMITRIPTYLINE HCL 10 MG PO TABS: 10.0000 mg | ORAL_TABLET | Freq: Every day | ORAL | 2 refills | Status: AC

## 2024-06-20 NOTE — Progress Notes (Signed)
   Subjective:    Patient ID: Bernard James, male    DOB: 1960-12-22, 63 y.o.   MRN: [id number]  HPI Here to discuss his insomnia and to get some labs drawn. He has struggled with sleep for many years, and all the medications he has tried have either given him side effects or they have not worked. These include Temazepam , Lunesta , Ambien , and Trazodone . He takes Xanax  as needed for anxiety but it does not help him sleep.    Review of Systems  Constitutional:  Positive for fatigue.  Respiratory: Negative.    Cardiovascular: Negative.   Psychiatric/Behavioral:  Positive for sleep disturbance. Negative for agitation, confusion, dysphoric mood and hallucinations. The patient is nervous/anxious.        Objective:   Physical Exam Constitutional:      Appearance: Normal appearance.  Cardiovascular:     Rate and Rhythm: Normal rate and regular rhythm.     Pulses: Normal pulses.     Heart sounds: Normal heart sounds.  Pulmonary:     Effort: Pulmonary effort is normal.     Breath sounds: Normal breath sounds.  Neurological:     Mental Status: He is alert and oriented to person, place, and time.  Psychiatric:        Mood and Affect: Mood normal.        Behavior: Behavior normal.        Thought Content: Thought content normal.           Assessment & Plan:  For his insomnia, he will try Amitriptyline  10 mg at bedtime. We can titrate up on the dose if needed. Garnette Olmsted, MD

## 2024-06-21 LAB — VITAMIN B12: Vitamin B-12: 742 pg/mL (ref 200–1100)

## 2024-06-21 LAB — CBC WITH DIFFERENTIAL/PLATELET
Absolute Lymphocytes: 1125 {cells}/uL (ref 850–3900)
Absolute Monocytes: 400 {cells}/uL (ref 200–950)
Basophils Absolute: 41 {cells}/uL (ref 0–200)
Basophils Relative: 0.7 %
Eosinophils Absolute: 261 {cells}/uL (ref 15–500)
Eosinophils Relative: 4.5 %
HCT: 45 % (ref 38.5–50.0)
Hemoglobin: 15.3 g/dL (ref 13.2–17.1)
MCH: 30.5 pg (ref 27.0–33.0)
MCHC: 34 g/dL (ref 32.0–36.0)
MCV: 89.8 fL (ref 80.0–100.0)
MPV: 9.3 fL (ref 7.5–12.5)
Monocytes Relative: 6.9 %
Neutro Abs: 3973 {cells}/uL (ref 1500–7800)
Neutrophils Relative %: 68.5 %
Platelets: 236 Thousand/uL (ref 140–400)
RBC: 5.01 Million/uL (ref 4.20–5.80)
RDW: 12.8 % (ref 11.0–15.0)
Total Lymphocyte: 19.4 %
WBC: 5.8 Thousand/uL (ref 3.8–10.8)

## 2024-06-21 LAB — HEMOGLOBIN A1C
Hgb A1c MFr Bld: 5.2 % (ref <5.7)
Mean Plasma Glucose: 103 mg/dL
eAG (mmol/L): 5.7 mmol/L

## 2024-06-21 LAB — BASIC METABOLIC PANEL WITH GFR
BUN: 9 mg/dL (ref 7–25)
CO2: 27 mmol/L (ref 20–32)
Calcium: 9.4 mg/dL (ref 8.6–10.3)
Chloride: 105 mmol/L (ref 98–110)
Creat: 0.93 mg/dL (ref 0.70–1.35)
Glucose, Bld: 99 mg/dL (ref 65–99)
Potassium: 4 mmol/L (ref 3.5–5.3)
Sodium: 139 mmol/L (ref 135–146)
eGFR: 93 mL/min/1.73m2 (ref >=60)

## 2024-06-21 LAB — PSA: PSA: 0.88 ng/mL (ref <=4.00)

## 2024-06-21 LAB — TESTOSTERONE: Testosterone: 390 ng/dL (ref 250–827)

## 2024-06-21 LAB — LIPID PANEL
Cholesterol: 232 mg/dL — ABNORMAL HIGH (ref <200)
HDL: 36 mg/dL — ABNORMAL LOW (ref >=40)
LDL Cholesterol (Calc): 143 mg/dL — ABNORMAL HIGH
Non-HDL Cholesterol (Calc): 196 mg/dL — ABNORMAL HIGH (ref <130)
Total CHOL/HDL Ratio: 6.4 (calc) — ABNORMAL HIGH (ref <5.0)
Triglycerides: 342 mg/dL — ABNORMAL HIGH (ref <150)

## 2024-06-21 LAB — HEPATIC FUNCTION PANEL
AG Ratio: 2.3 (calc) (ref 1.0–2.5)
ALT: 31 U/L (ref 9–46)
AST: 21 U/L (ref 10–35)
Albumin: 4.8 g/dL (ref 3.6–5.1)
Alkaline phosphatase (APISO): 81 U/L (ref 35–144)
Bilirubin, Direct: 0.2 mg/dL (ref 0.0–0.2)
Globulin: 2.1 g/dL (ref 1.9–3.7)
Indirect Bilirubin: 0.7 mg/dL (ref 0.2–1.2)
Total Bilirubin: 0.9 mg/dL (ref 0.2–1.2)
Total Protein: 6.9 g/dL (ref 6.1–8.1)

## 2024-06-21 LAB — TSH: TSH: 1.93 m[IU]/L (ref 0.40–4.50)

## 2024-06-23 ENCOUNTER — Ambulatory Visit: Payer: Self-pay | Admitting: Family Medicine

## 2024-06-24 ENCOUNTER — Ambulatory Visit: Admitting: Internal Medicine

## 2024-07-31 ENCOUNTER — Ambulatory Visit: Admitting: Nurse Practitioner

## 2024-07-31 ENCOUNTER — Ambulatory Visit: Admitting: Internal Medicine

## 2024-07-31 ENCOUNTER — Encounter: Payer: Self-pay | Admitting: Nurse Practitioner

## 2024-07-31 VITALS — BP 110/60 | HR 62 | Ht 75.0 in | Wt 227.0 lb

## 2024-07-31 DIAGNOSIS — F5101 Primary insomnia: Secondary | ICD-10-CM

## 2024-07-31 DIAGNOSIS — G4733 Obstructive sleep apnea (adult) (pediatric): Secondary | ICD-10-CM | POA: Diagnosis not present

## 2024-07-31 NOTE — Patient Instructions (Addendum)
 Continue to use CPAP every night, minimum of 4-6 hours a night.  Change equipment as directed. Wash your tubing with warm soap and water daily, hang to dry. Wash humidifier portion weekly. Use bottled, distilled water and change daily Be aware of reduced alertness and do not drive or operate heavy machinery if experiencing this or drowsiness.  Exercise encouraged, as tolerated. Healthy weight management discussed.  Avoid or decrease alcohol consumption and medications that make you more sleepy, if possible. Notify if persistent daytime sleepiness occurs even with consistent use of PAP therapy.  Change CPAP supplies... Every month Mask cushions and/or nasal pillows CPAP machine filters Every 3 months Mask frame (not including the headgear) CPAP tubing Every 6 months Mask headgear Chin strap (if applicable) Humidifier water tub  Look into cognitive behavioral therapy for your insomnia management with a local therapist  Follow up in 6 months with Dr. Jess, or sooner, if needed

## 2024-07-31 NOTE — Assessment & Plan Note (Signed)
 Mild OSA on CPAP. Excellent compliance and control. Receives benefit from use. Minimal risks of untreated mild OSA. Will continue CPAP therapy nightly. Encouraged to reach out to DME company about obtaining a 8 or 10 ft hose. Aware of proper care/use of CPAP device. Safe driving practices reviewed.   Patient Instructions  Continue to use CPAP every night, minimum of 4-6 hours a night.  Change equipment as directed. Wash your tubing with warm soap and water daily, hang to dry. Wash humidifier portion weekly. Use bottled, distilled water and change daily Be aware of reduced alertness and do not drive or operate heavy machinery if experiencing this or drowsiness.  Exercise encouraged, as tolerated. Healthy weight management discussed.  Avoid or decrease alcohol consumption and medications that make you more sleepy, if possible. Notify if persistent daytime sleepiness occurs even with consistent use of PAP therapy.  Change CPAP supplies... Every month Mask cushions and/or nasal pillows CPAP machine filters Every 3 months Mask frame (not including the headgear) CPAP tubing Every 6 months Mask headgear Chin strap (if applicable) Humidifier water tub  Look into cognitive behavioral therapy for your insomnia management with a local therapist  Follow up in 6 months with Dr. Jess, or sooner, if needed

## 2024-07-31 NOTE — Assessment & Plan Note (Signed)
 Significant insomniac. Hx of stroke related to vertebral artery dissection which likely contributes to altered sleep architect. Failed multiple pharmacological options. PCP recently started amitriptyline ; advised him to take at his goal bedtime with plan to fall asleep within 30 min-1 hr and remain in bed for 7-8 hours to avoid daytime grogginess/morning hangover. Would likely benefit from cognitive behavioral therapy - recommended he see about insurance coverage and local therapists. Sleep hygiene again reviewed.

## 2024-07-31 NOTE — Progress Notes (Signed)
 @Patient  ID: Bernard James, male    DOB: Feb 07, 1961, 63 y.o.   MRN: [id number]  Chief Complaint  Patient presents with   Sleep Apnea    Hose is a little to short, wears nightly but doesn't feel like he isnt sleeping well    Referring provider: Johnny Garnette LABOR, MD  HPI: 63 year old male, never smoker followed for OSA on CPAP and insomnia. He is a patient of Dr. Chase and last seen in office 06/04/2024. Past medical history significant for allergic rhinitis, GERD, HLD, depression, anxiety.   TEST/EVENTS:  12/08/2022 HST: AHI 7.4/h, SpO2 low 87%  06/04/2024: OV with Dr. Jess. Difficulties with sleep initiation and maintenance. Reports getting into bed around 2-3 am and not being able to fall asleep until 5-6 am most days. Sleep schedule has been this way for a while. Using CPAP consistently. Trial trazodone . Provided with info on sleep hygiene.   07/31/2024: Today - follow up Discussed the use of AI scribe software for clinical note transcription with the patient, who gave verbal consent to proceed.  History of Present Illness Bernard James is a 63 year old male who presents for CPAP follow-up and sleep issues.  He is currently using a standard six-foot hose and is interested in obtaining a longer ten-foot hose for his CPAP machine. He does sleep with his CPAP every night. Feels he benefits from use of it. His only issue is that when he turns over, the tube will pull on the mask. He's not asked the DME about a longer hose option. He denies any issues with mask fit or pressure. Likes his memory foam mask. Denies any drowsy driving.   He experiences sleep disturbances and was recently prescribed amitriptyline  by his primary care provider. Trazodone  did not help when using previously. He's tried ambien , temazepam , lunesta  without success. He tried the amitriptyline  once but he didn't take it until 5 am and then felt groggy the next day. He typically goes to bed around 2 or 3 AM but does  not fall asleep immediately. He has not tried cognitive behavioral therapy for insomnia but is aware of sleep hygiene practices such as turning off electronic devices, although he finds it difficult to adhere to these practices. He denies any mood changes. No sleep parasomnias.   He has a history of a vertebral artery dissection in 2000, which affected his brainstem, pons, and cerebellum. He wonders if this has impacted his sleep quality, as he has not had great sleep since the incident.  06/29/2024-07/28/2024: CPAP 5-10 cmH2O 30/30 days; 100% >4 hr; average use 8 hr 18 min Pressure 95th 8.2 95th 21 AHI 2.5   Allergies  Allergen Reactions   Flagyl  [Metronidazole ] Other (See Comments)    Dizziness    Doxycycline  Hyclate Nausea Only    REACTION: stomach upset    Immunization History  Administered Date(s) Administered   Influenza Whole 07/11/2008, 07/08/2010   Influenza,inj,Quad PF,6+ Mos 08/21/2013, 06/23/2014, 08/03/2015, 07/18/2016, 08/07/2017, 08/16/2018   Td 08/13/2009    Past Medical History:  Diagnosis Date   Allergy    allergic rhinitis   Anxiety    Depression    GERD (gastroesophageal reflux disease)    Hyperlipidemia    Stroke (HCC)    in cerebellum   Tension headache     Tobacco History: Social History   Tobacco Use  Smoking Status Never  Smokeless Tobacco Never   Counseling given: Not Answered   Outpatient Medications Prior to Visit  Medication Sig Dispense Refill   albuterol  (PROVENTIL ) (2.5 MG/3ML) 0.083% nebulizer solution Take 3 mLs (2.5 mg total) by nebulization every 6 (six) hours as needed for wheezing or shortness of breath. 75 mL 5   albuterol  (VENTOLIN  HFA) 108 (90 Base) MCG/ACT inhaler Inhale 2 puffs into the lungs every 6 (six) hours as needed for wheezing or shortness of breath.     ALPRAZolam  (XANAX ) 0.25 MG tablet TAKE 1 TABLET(0.25 MG) BY MOUTH THREE TIMES DAILY AS NEEDED FOR ANXIETY 90 tablet 5   amitriptyline  (ELAVIL ) 10 MG tablet Take  1 tablet (10 mg total) by mouth at bedtime. 30 tablet 2   aspirin 81 MG tablet Take 81 mg by mouth daily.       clindamycin  (CLINDAGEL) 1 % gel APPLY TOPICALLY TO THE AFFECTED AREA TWICE DAILY 30 g 11   desoximetasone  (TOPICORT ) 0.25 % cream Apply 1 Application topically 2 (two) times daily. 30 g 5   famotidine (PEPCID) 20 MG tablet Take 20 mg by mouth 2 (two) times daily as needed for heartburn or indigestion. take half a tablet of the 20 MG twice daily     fluticasone  (FLONASE ) 50 MCG/ACT nasal spray SHAKE LIQUID AND USE 2 SPRAYS IN EACH NOSTRIL DAILY AS NEEDED 48 g 0   MAGNESIUM PO Take by mouth.     OVER THE COUNTER MEDICATION Chloortrimeton as needed     Testosterone  1.62 % GEL PLACE 4 SQUIRTS ONTO THE SKIN DAILY 75 g 1   tretinoin  (RETIN-A ) 0.1 % cream APPLY EXTERNALLY TO THE AFFECTED AREA AT BEDTIME 20 g 11   No facility-administered medications prior to visit.     Review of Systems: as above    Physical Exam:  BP 110/60   Pulse 62   Ht 6' 3 (1.905 m)   Wt 227 lb (103 kg)   SpO2 96%   BMI 28.37 kg/m   GEN: Pleasant, interactive, well-appearing; in no acute distress HEENT:  Normocephalic and atraumatic. PERRLA. Sclera white. Nasal turbinates pink, moist and patent bilaterally. No rhinorrhea present. Oropharynx pink and moist, without exudate or edema. No lesions, ulcerations, or postnasal drip. Mallampati III NECK:  Supple w/ fair ROM. No lymphadenopathy.   CV: RRR, no m/r/g, no peripheral edema. PULMONARY:  Unlabored, regular breathing. Clear bilaterally A&P w/o wheezes/rales/rhonchi. No accessory muscle use.  GI: BS present and normoactive. Soft, non-tender to palpation. MSK: No erythema, warmth or tenderness.  Neuro: A/Ox3. No focal deficits noted.   Skin: Warm, no lesions or rashe Psych: Normal affect and behavior. Judgement and thought content appropriate.     Lab Results:  CBC    Component Value Date/Time   WBC 5.8 06/20/2024 1557   RBC 5.01 06/20/2024  1557   HGB 15.3 06/20/2024 1557   HGB 14.8 06/04/2014 1614   HCT 45.0 06/20/2024 1557   HCT 43.7 06/04/2014 1614   PLT 236 06/20/2024 1557   PLT 192 06/04/2014 1614   MCV 89.8 06/20/2024 1557   MCV 89 06/04/2014 1614   MCH 30.5 06/20/2024 1557   MCHC 34.0 06/20/2024 1557   RDW 12.8 06/20/2024 1557   RDW 13.2 06/04/2014 1614   LYMPHSABS 1.0 07/24/2022 1609   LYMPHSABS 0.8 (L) 06/04/2014 1614   MONOABS 0.4 07/24/2022 1609   MONOABS 0.3 06/04/2014 1614   EOSABS 261 06/20/2024 1557   EOSABS 0.1 06/04/2014 1614   BASOSABS 41 06/20/2024 1557   BASOSABS 0.1 06/04/2014 1614    BMET    Component Value Date/Time  NA 139 06/20/2024 1557   NA 141 06/04/2014 1614   K 4.0 06/20/2024 1557   K 3.8 06/04/2014 1614   CL 105 06/20/2024 1557   CL 106 06/04/2014 1614   CO2 27 06/20/2024 1557   CO2 26 06/04/2014 1614   GLUCOSE 99 06/20/2024 1557   GLUCOSE 98 06/04/2014 1614   BUN 9 06/20/2024 1557   BUN 13 06/04/2014 1614   CREATININE 0.93 06/20/2024 1557   CALCIUM  9.4 06/20/2024 1557   CALCIUM  9.1 06/04/2014 1614   GFRNONAA >60 08/24/2017 1410   GFRNONAA >60 06/04/2014 1614   GFRAA >60 08/24/2017 1410   GFRAA >60 06/04/2014 1614    BNP No results found for: BNP   Imaging:  No results found.  Administration History     None           No data to display          No results found for: NITRICOXIDE      Assessment & Plan:   OSA on CPAP Mild OSA on CPAP. Excellent compliance and control. Receives benefit from use. Minimal risks of untreated mild OSA. Will continue CPAP therapy nightly. Encouraged to reach out to DME company about obtaining a 8 or 10 ft hose. Aware of proper care/use of CPAP device. Safe driving practices reviewed.   Patient Instructions  Continue to use CPAP every night, minimum of 4-6 hours a night.  Change equipment as directed. Wash your tubing with warm soap and water daily, hang to dry. Wash humidifier portion weekly. Use bottled,  distilled water and change daily Be aware of reduced alertness and do not drive or operate heavy machinery if experiencing this or drowsiness.  Exercise encouraged, as tolerated. Healthy weight management discussed.  Avoid or decrease alcohol consumption and medications that make you more sleepy, if possible. Notify if persistent daytime sleepiness occurs even with consistent use of PAP therapy.  Change CPAP supplies... Every month Mask cushions and/or nasal pillows CPAP machine filters Every 3 months Mask frame (not including the headgear) CPAP tubing Every 6 months Mask headgear Chin strap (if applicable) Humidifier water tub  Look into cognitive behavioral therapy for your insomnia management with a local therapist  Follow up in 6 months with Dr. Jess, or sooner, if needed    Insomnia Significant insomniac. Hx of stroke related to vertebral artery dissection which likely contributes to altered sleep architect. Failed multiple pharmacological options. PCP recently started amitriptyline ; advised him to take at his goal bedtime with plan to fall asleep within 30 min-1 hr and remain in bed for 7-8 hours to avoid daytime grogginess/morning hangover. Would likely benefit from cognitive behavioral therapy - recommended he see about insurance coverage and local therapists. Sleep hygiene again reviewed.    Advised if symptoms do not improve or worsen, to please contact office for sooner follow up or seek emergency care.   I spent 32 minutes of dedicated to the care of this patient on the date of this encounter to include pre-visit review of records, face-to-face time with the patient discussing conditions above, post visit ordering of testing, clinical documentation with the electronic health record, making appropriate referrals as documented, and communicating necessary findings to members of the patients care team.  Comer LULLA Rouleau, NP 07/31/2024  Pt aware and understands NP's role.

## 2024-09-08 ENCOUNTER — Ambulatory Visit: Admitting: Sleep Medicine

## 2024-10-17 ENCOUNTER — Other Ambulatory Visit: Payer: Self-pay | Admitting: Family Medicine
